# Patient Record
Sex: Female | Born: 1960 | Marital: Single | Smoking: Never smoker
Health system: Southern US, Community
[De-identification: ages and names within clinical notes are randomized; demographics above are authoritative.]

## PROBLEM LIST (undated history)

## (undated) DIAGNOSIS — K219 Gastro-esophageal reflux disease without esophagitis: Secondary | ICD-10-CM

## (undated) DIAGNOSIS — E785 Hyperlipidemia, unspecified: Secondary | ICD-10-CM

## (undated) DIAGNOSIS — T7840XA Allergy, unspecified, initial encounter: Secondary | ICD-10-CM

## (undated) HISTORY — DX: Allergy, unspecified, initial encounter: T78.40XA

## (undated) HISTORY — DX: Hyperlipidemia, unspecified: E78.5

## (undated) HISTORY — DX: Gastro-esophageal reflux disease without esophagitis: K21.9

---

## 2019-06-20 ENCOUNTER — Ambulatory Visit: Payer: Self-pay | Attending: Internal Medicine

## 2019-06-20 DIAGNOSIS — Z23 Encounter for immunization: Secondary | ICD-10-CM

## 2019-06-20 NOTE — Progress Notes (Signed)
   Covid-19 Vaccination Clinic  Name:  Stacey Parrish    MRN: [id number] DOB: 11/30/1960  06/20/2019  [name] was observed post Covid-19 immunization for 15 minutes without incident. She was provided with Vaccine Information Sheet and instruction to access the V-Safe system.   [name] was instructed to call 911 with any severe reactions post vaccine: Marland Kitchen Difficulty breathing  . Swelling of face and throat  . A fast heartbeat  . A bad rash all over body  . Dizziness and weakness   Immunizations Administered    Name Date Dose VIS Date Route   Pfizer COVID-19 Vaccine 06/20/2019 10:59 AM 0.3 mL 02/28/2019 Intramuscular   Manufacturer: [employer], [employer]   Lot: [id number]   NDC: [address]-9

## 2019-07-15 ENCOUNTER — Ambulatory Visit: Payer: Self-pay | Attending: Internal Medicine

## 2019-07-15 DIAGNOSIS — Z23 Encounter for immunization: Secondary | ICD-10-CM

## 2019-07-15 NOTE — Progress Notes (Signed)
   Covid-19 Vaccination Clinic  Name:  Clarence Dunsmore    MRN: [id number] DOB: [birth year]  07/15/2019  Ms. Hechavarria was observed post Covid-19 immunization for 15 minutes without incident. She was provided with Vaccine Information Sheet and instruction to access the V-Safe system.   Ms. Knights was instructed to call 911 with any severe reactions post vaccine: Marland Kitchen Difficulty breathing  . Swelling of face and throat  . A fast heartbeat  . A bad rash all over body  . Dizziness and weakness   Immunizations Administered    Name Date Dose VIS Date Route   Pfizer COVID-19 Vaccine 07/15/2019  4:26 PM 0.3 mL 05/14/2018 Intramuscular   Manufacturer: [employer], [employer]   Lot: [id number]   NDC: [address]-0

## 2020-01-05 DIAGNOSIS — Z1231 Encounter for screening mammogram for malignant neoplasm of breast: Secondary | ICD-10-CM | POA: Diagnosis not present

## 2020-01-05 DIAGNOSIS — Z01419 Encounter for gynecological examination (general) (routine) without abnormal findings: Secondary | ICD-10-CM | POA: Diagnosis not present

## 2020-01-05 DIAGNOSIS — Z124 Encounter for screening for malignant neoplasm of cervix: Secondary | ICD-10-CM | POA: Diagnosis not present

## 2020-01-09 DIAGNOSIS — Z1231 Encounter for screening mammogram for malignant neoplasm of breast: Secondary | ICD-10-CM | POA: Diagnosis not present

## 2020-03-05 ENCOUNTER — Ambulatory Visit: Payer: Self-pay | Admitting: Family Medicine

## 2020-03-05 ENCOUNTER — Encounter: Payer: Self-pay | Admitting: Family Medicine

## 2020-03-05 ENCOUNTER — Ambulatory Visit: Payer: BLUE CROSS/BLUE SHIELD | Admitting: Family Medicine

## 2020-03-05 ENCOUNTER — Other Ambulatory Visit: Payer: Self-pay

## 2020-03-05 VITALS — BP 132/78 | HR 63 | Temp 96.9°F | Ht <= 58 in | Wt 130.1 lb

## 2020-03-05 DIAGNOSIS — N3000 Acute cystitis without hematuria: Secondary | ICD-10-CM | POA: Diagnosis not present

## 2020-03-05 DIAGNOSIS — Z0000 Encounter for general adult medical examination without abnormal findings: Secondary | ICD-10-CM

## 2020-03-05 DIAGNOSIS — R350 Frequency of micturition: Secondary | ICD-10-CM | POA: Diagnosis not present

## 2020-03-05 LAB — POC URINALSYSI DIPSTICK (AUTOMATED)
Bilirubin, UA: NEGATIVE
Blood, UA: 100
Glucose, UA: NEGATIVE
Ketones, UA: NEGATIVE
Nitrite, UA: NEGATIVE
Protein, UA: POSITIVE — AB
Spec Grav, UA: 1.015 (ref 1.010–1.025)
Urobilinogen, UA: 0.2 E.U./dL
pH, UA: 6.5 (ref 5.0–8.0)

## 2020-03-05 LAB — CBC WITH DIFFERENTIAL/PLATELET
Basophils Absolute: 0 10*3/uL (ref 0.0–0.1)
Basophils Relative: 0.6 % (ref 0.0–3.0)
Eosinophils Absolute: 0.1 10*3/uL (ref 0.0–0.7)
Eosinophils Relative: 1.5 % (ref 0.0–5.0)
HCT: 41.5 % (ref 36.0–46.0)
Hemoglobin: 13.8 g/dL (ref 12.0–15.0)
Lymphocytes Relative: 28.9 % (ref 12.0–46.0)
Lymphs Abs: 1.9 10*3/uL (ref 0.7–4.0)
MCHC: 33.2 g/dL (ref 30.0–36.0)
MCV: 86.4 fl (ref 78.0–100.0)
Monocytes Absolute: 0.5 10*3/uL (ref 0.1–1.0)
Monocytes Relative: 7.5 % (ref 3.0–12.0)
Neutro Abs: 4 10*3/uL (ref 1.4–7.7)
Neutrophils Relative %: 61.5 % (ref 43.0–77.0)
Platelets: 303 10*3/uL (ref 150.0–400.0)
RBC: 4.81 Mil/uL (ref 3.87–5.11)
RDW: 12.2 % (ref 11.5–15.5)
WBC: 6.4 10*3/uL (ref 4.0–10.5)

## 2020-03-05 LAB — COMPREHENSIVE METABOLIC PANEL
ALT: 25 U/L (ref 0–35)
AST: 24 U/L (ref 0–37)
Albumin: 4.3 g/dL (ref 3.5–5.2)
Alkaline Phosphatase: 93 U/L (ref 39–117)
BUN: 10 mg/dL (ref 6–23)
CO2: 31 mEq/L (ref 19–32)
Calcium: 9.5 mg/dL (ref 8.4–10.5)
Chloride: 101 mEq/L (ref 96–112)
Creatinine, Ser: 0.71 mg/dL (ref 0.40–1.20)
GFR: 93.59 mL/min (ref >60.00)
Glucose, Bld: 83 mg/dL (ref 70–99)
Potassium: 4.2 mEq/L (ref 3.5–5.1)
Sodium: 138 mEq/L (ref 135–145)
Total Bilirubin: 0.5 mg/dL (ref 0.2–1.2)
Total Protein: 7.5 g/dL (ref 6.0–8.3)

## 2020-03-05 LAB — LIPID PANEL
Cholesterol: 239 mg/dL — ABNORMAL HIGH (ref 0–200)
HDL: 71.3 mg/dL (ref >39.00)
LDL Cholesterol: 153 mg/dL — ABNORMAL HIGH (ref 0–99)
NonHDL: 168.12
Total CHOL/HDL Ratio: 3
Triglycerides: 78 mg/dL (ref 0.0–149.0)
VLDL: 15.6 mg/dL (ref 0.0–40.0)

## 2020-03-05 LAB — TSH: TSH: 4.29 u[IU]/mL (ref 0.35–4.50)

## 2020-03-05 MED ORDER — CEPHALEXIN 250 MG PO CAPS: 250.0000 mg | ORAL_CAPSULE | Freq: Two times a day (BID) | ORAL | 0 refills | Status: DC

## 2020-03-05 NOTE — Progress Notes (Signed)
Subjective:    Patient ID: Stacey Parrish, female    DOB: 1960/07/17, 59 y.o.   MRN: [id number]  This visit occurred during the SARS-CoV-2 public health emergency.  Safety protocols were in place, including screening questions prior to the visit, additional usage of staff PPE, and extensive cleaning of exam room while observing appropriate contact time as indicated for disinfecting solutions.    HPI 59 yo female here to est for primary care   Originally from [place]    Moved here from [place] Really loves it  Lives in [place] here is translating for her  Speaks a little english   Wt Readings from Last 3 Encounters:  03/05/20 130 lb 1 oz (59 kg)   28.39 kg/m   C/o urinary frequency -recently  Dysuria -feels hot and uncomfortable No blood (once in the more distant past) Has had utis in the past  Has hot flashes occ but no fever  No bladder pain    ua today pos for blood and leuk Results for orders placed or performed in visit on 03/05/20  POCT Urinalysis Dipstick (Automated)  Result Value Ref Range   Color, UA Light Yellow    Clarity, UA Clear    Glucose, UA Negative Negative   Bilirubin, UA Negative    Ketones, UA Negative    Spec Grav, UA 1.015 1.010 - 1.025   Blood, UA 100 Ery/uL    pH, UA 6.5 5.0 - 8.0   Protein, UA Positive (A) Negative   Urobilinogen, UA 0.2 0.2 or 1.0 E.U./dL   Nitrite, UA Negative    Leukocytes, UA Small (1+) (A) Negative     Had mammogram 01/09/20  Had her pap 2 mo ago Had ascus with HPV negative  G1P1 LMP 2016  Is menopausal   Pfizer covid imm in April  Has a booster planned through work soon   Colonoscopy was 2019 in [place] ? 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 Acute cystitis 03/05/2020  . Routine general medical examination at a health care facility 03/05/2020   History reviewed. No pertinent past medical history. History reviewed. No pertinent surgical history. Social History   Tobacco Use  . Smoking status: Never Smoker  . Smokeless tobacco: Never Used  Substance Use Topics  . Alcohol use: Not Currently   Family History  Problem Relation Age of Onset  . Arthritis Mother   . Hearing loss Mother   . Hyperlipidemia Mother   . Hypertension Mother   . Alcohol abuse Father    No Known Allergies No current outpatient medications on file prior to visit.   No current facility-administered medications on file prior to visit.    Review of Systems  Constitutional: Negative for activity change, appetite change, fatigue, fever and unexpected weight change.  HENT: Negative for congestion, ear pain, rhinorrhea, sinus pressure and sore throat.   Eyes: Negative for pain, redness and visual disturbance.  Respiratory: Negative for cough, shortness of breath and wheezing.   Cardiovascular: Negative for chest pain and palpitations.  Gastrointestinal: Negative for abdominal pain, blood in stool, constipation and diarrhea.  Endocrine: Negative for polydipsia and polyuria.  Genitourinary: Positive for dysuria, frequency and urgency. Negative for decreased urine volume and vaginal discharge.  Musculoskeletal: Negative for arthralgias, back pain and myalgias.  Skin: Negative for pallor and rash.  Allergic/Immunologic: Negative for environmental allergies.  Neurological: Negative for dizziness, syncope and headaches.  Hematological: Negative for adenopathy. Does not bruise/bleed easily.  Psychiatric/Behavioral: Negative for decreased concentration and dysphoric mood. The patient is not nervous/anxious.        Objective:   Physical Exam Constitutional:      General: She is not in acute distress.    Appearance: Normal appearance. She is  well-developed, normal weight and well-nourished. She is not ill-appearing.  HENT:     Head: Normocephalic and atraumatic.  Eyes:     Extraocular Movements: EOM normal.     Conjunctiva/sclera: Conjunctivae normal.     Pupils: Pupils are equal, round, and reactive to light.  Cardiovascular:     Rate and Rhythm: Normal rate and regular rhythm.     Pulses: Normal pulses.     Heart sounds: Normal heart sounds.  Pulmonary:     Effort: Pulmonary effort is normal.     Breath sounds: Normal breath sounds.  Abdominal:     General: Bowel sounds are normal. There is no distension.     Palpations: Abdomen is soft.     Tenderness: There is abdominal tenderness. There is no rebound.     Comments: No cva tenderness  Mild suprapubic tenderness  Musculoskeletal:        General: No edema.     Cervical back: Normal range of motion and neck supple.     Comments: No kyphosis   Lymphadenopathy:     Cervical: No cervical adenopathy.  Skin:    Findings: No erythema or rash.  Neurological:     Mental Status: She is alert.     Coordination: Coordination normal.     Deep Tendon Reflexes: Reflexes normal.  Psychiatric:        Mood and Affect: Mood and affect and mood normal.     Comments: Pleasant  Son translates for her           Assessment & Plan:   Problem List Items Addressed This Visit      Genitourinary   Acute cystitis - Primary    Pending urine cx tx with keflex Meds ordered this encounter  Medications  . cephALEXin (KEFLEX) 250 MG capsule    Sig: Take 1 capsule (250 mg total) by mouth 2 (two) times daily.    Dispense:  14 capsule    Refill:  0   Enc water intake  Handout given  Per hx- has had these in the past  inst to call if symptoms worsen       Relevant Orders   Urine Culture (Completed)     Other   Routine general medical examination at a health care facility    Labs done for general care/prevention today  (new pt)  Pap and mammogram are up to date  Rev imms   Planning covid booster soon  Treating for uti   Sent for last colonoscopy report       Relevant Orders   Comprehensive metabolic panel (Completed)   CBC with Differential/Platelet (Completed)   Lipid panel (Completed)   TSH (Completed)    Other Visit Diagnoses    Urinary frequency       Relevant Orders   POCT Urinalysis Dipstick (Automated) (Completed)

## 2020-03-05 NOTE — Patient Instructions (Addendum)
Labs today for screening/wellness  You may have a urinary tract infection  Drink lots of water  Take keflex (antibiotic) as directed  We will culture your urine and call with result   Take care of yourself   Get your covid booster   Stop at check out so we can send for your colonocopy report

## 2020-03-06 LAB — URINE CULTURE
MICRO NUMBER:: 11332125
SPECIMEN QUALITY:: ADEQUATE

## 2020-03-07 NOTE — Assessment & Plan Note (Signed)
Labs done for general care/prevention today  (new pt)  Pap and mammogram are up to date  Rev imms  Planning covid booster soon  Treating for uti   Sent for last colonoscopy report

## 2020-03-07 NOTE — Assessment & Plan Note (Signed)
Pending urine cx tx with keflex Meds ordered this encounter  Medications  . cephALEXin (KEFLEX) 250 MG capsule    Sig: Take 1 capsule (250 mg total) by mouth 2 (two) times daily.    Dispense:  14 capsule    Refill:  0   Enc water intake  Handout given  Per hx- has had these in the past  inst to call if symptoms worsen

## 2020-03-11 ENCOUNTER — Telehealth: Payer: Self-pay | Admitting: *Deleted

## 2020-03-11 NOTE — Telephone Encounter (Signed)
-----   Message from Judy Pimple, MD sent at 03/07/2020  9:50 AM EST ----- Urine cx was contaminated- please ask if symptoms are improved with abx ?  Cholesterol is high  Avoid red meat/ fried foods/ egg yolks/ fatty breakfast meats/ butter, cheese and high fat dairy/ and shellfish    I would like to re check lipid panel in 2-3 mo after watching diet  Other labs ok

## 2020-03-11 NOTE — Telephone Encounter (Signed)
Left VM requesting pt to call the office back 

## 2020-03-17 ENCOUNTER — Other Ambulatory Visit: Payer: Self-pay | Admitting: Family Medicine

## 2020-03-17 ENCOUNTER — Telehealth: Payer: Self-pay | Admitting: Family Medicine

## 2020-03-17 MED ORDER — CIPROFLOXACIN HCL 250 MG PO TABS: 250.0000 mg | ORAL_TABLET | Freq: Two times a day (BID) | ORAL | 0 refills | Status: DC

## 2020-03-17 NOTE — Telephone Encounter (Signed)
Pt son notified of Dr. Royden Purl comments and instructions and verbalized understanding

## 2020-03-17 NOTE — Telephone Encounter (Signed)
I sent in a different abx (cipro) to take bid for 5 d  Watch for side effects (like muscle pain) and let me know   Please drop off sample whenever able so we can get as clean catch as possible and try to avoid contamination  Thanks for letting me know

## 2020-03-17 NOTE — Telephone Encounter (Signed)
-----   Message from Shon Millet, [place] sent at 03/17/2020  3:20 PM EST ----- Spoke to pt's son advise of lab results and Dr. Royden Purl comments. F/u lab appt scheduled.  Pt's son said that urinary sxs are worse. She was 1st saying she was only having urinary frequency but now she has developed dysuria also. Son said pt's almost done with abx Dr. Milinda Antis prescribed and she hasn't had any improvement. Son wants to know what to do, please advise

## 2020-03-18 NOTE — Telephone Encounter (Signed)
Note on med says there is a back order on Cipro and ask if PCP can send in an alt abx

## 2020-03-18 NOTE — Telephone Encounter (Signed)
I sent levaquin

## 2020-03-26 ENCOUNTER — Telehealth: Payer: Self-pay | Admitting: Family Medicine

## 2020-03-26 ENCOUNTER — Other Ambulatory Visit (INDEPENDENT_AMBULATORY_CARE_PROVIDER_SITE_OTHER): Payer: BLUE CROSS/BLUE SHIELD

## 2020-03-26 DIAGNOSIS — N3000 Acute cystitis without hematuria: Secondary | ICD-10-CM

## 2020-03-26 LAB — URINALYSIS, ROUTINE W REFLEX MICROSCOPIC
Bilirubin Urine: NEGATIVE
Ketones, ur: NEGATIVE
Leukocytes,Ua: NEGATIVE
Nitrite: NEGATIVE
Specific Gravity, Urine: 1.02 (ref 1.000–1.030)
Total Protein, Urine: NEGATIVE
Urine Glucose: NEGATIVE
Urobilinogen, UA: 0.2 (ref 0.0–1.0)
WBC, UA: NONE SEEN (ref 0–?)
pH: 5.5 (ref 5.0–8.0)

## 2020-03-26 NOTE — Telephone Encounter (Signed)
Thanks so much. 

## 2020-03-26 NOTE — Telephone Encounter (Signed)
-----   Message from Alvina Chou sent at 03/26/2020  2:53 PM EST ----- Regarding: lab order I ordered ua and culture. Son brought her in, didn't have orders.

## 2020-03-27 LAB — URINE CULTURE
MICRO NUMBER:: 11394471
SPECIMEN QUALITY:: ADEQUATE

## 2020-05-14 ENCOUNTER — Other Ambulatory Visit: Payer: Self-pay

## 2020-05-14 ENCOUNTER — Encounter: Payer: Self-pay | Admitting: Family Medicine

## 2020-05-14 ENCOUNTER — Ambulatory Visit: Payer: BLUE CROSS/BLUE SHIELD | Admitting: Family Medicine

## 2020-05-14 ENCOUNTER — Other Ambulatory Visit: Payer: BLUE CROSS/BLUE SHIELD

## 2020-05-14 VITALS — BP 128/70 | HR 64 | Temp 96.9°F | Ht <= 58 in | Wt 131.2 lb

## 2020-05-14 DIAGNOSIS — R3129 Other microscopic hematuria: Secondary | ICD-10-CM | POA: Insufficient documentation

## 2020-05-14 DIAGNOSIS — R1013 Epigastric pain: Secondary | ICD-10-CM

## 2020-05-14 DIAGNOSIS — E7800 Pure hypercholesterolemia, unspecified: Secondary | ICD-10-CM | POA: Diagnosis not present

## 2020-05-14 DIAGNOSIS — Z87448 Personal history of other diseases of urinary system: Secondary | ICD-10-CM

## 2020-05-14 DIAGNOSIS — E785 Hyperlipidemia, unspecified: Secondary | ICD-10-CM | POA: Insufficient documentation

## 2020-05-14 LAB — POC URINALSYSI DIPSTICK (AUTOMATED)
Bilirubin, UA: NEGATIVE
Blood, UA: 100
Glucose, UA: NEGATIVE
Ketones, UA: NEGATIVE
Leukocytes, UA: NEGATIVE
Nitrite, UA: NEGATIVE
Protein, UA: NEGATIVE
Spec Grav, UA: 1.025 (ref 1.010–1.025)
Urobilinogen, UA: 0.2 E.U./dL
pH, UA: 6 (ref 5.0–8.0)

## 2020-05-14 MED ORDER — FAMOTIDINE 20 MG PO TABS: 20.0000 mg | ORAL_TABLET | Freq: Two times a day (BID) | ORAL | 11 refills | Status: DC

## 2020-05-14 NOTE — Assessment & Plan Note (Signed)
Heartburn and regurg of acid Rev diet  Px pepcid to try bid and report back  Update if worse or not imp in 2 week

## 2020-05-14 NOTE — Progress Notes (Signed)
Subjective:    Patient ID: Stacey Parrish, female    DOB: Feb 07, 1961, 60 y.o.   MRN: [id number]  This visit occurred during the SARS-CoV-2 public health emergency.  Safety protocols were in place, including screening questions prior to the visit, additional usage of staff PPE, and extensive cleaning of exam room while observing appropriate contact time as indicated for disinfecting solutions.    HPI Pt presents for urinary complaint and hyperlipidemia  Wt Readings from Last 3 Encounters:  05/14/20 131 lb 3 oz (59.5 kg)  03/05/20 130 lb 1 oz (59 kg)   28.64 kg/m   Was tx on 12/17 for uti with positive ua with keflex  Her cx came back contaminated  We changed abx to cipro when she developed worse dysuria  Now some improvement  2nd cx also came back with normal flora   Still has a little bit of "hot" feeling urination  First thing in the am  No fever  R low back hurt last week  No visible blood   Drinking a lot of water  Sometimes frequency No incontinence    Both of last ua had blood in it   Today: Results for orders placed or performed in visit on 05/14/20  POCT Urinalysis Dipstick (Automated)  Result Value Ref Range   Color, UA Yellow    Clarity, UA Clear    Glucose, UA Negative Negative   Bilirubin, UA Negative    Ketones, UA Negative    Spec Grav, UA 1.025 1.010 - 1.025   Blood, UA 100 Ery/uL    pH, UA 6.0 5.0 - 8.0   Protein, UA Negative Negative   Urobilinogen, UA 0.2 0.2 or 1.0 E.U./dL   Nitrite, UA Negative    Leukocytes, UA Negative Negative     Lab Results  Component Value Date   CREATININE 0.71 03/05/2020   BUN 10 03/05/2020   NA 138 03/05/2020   K 4.2 03/05/2020   CL 101 03/05/2020   CO2 31 03/05/2020     Hyperlipidemia Lab Results  Component Value Date   CHOL 239 (H) 03/05/2020   HDL 71.30 03/05/2020   LDLCALC 153 (H) 03/05/2020   TRIG 78.0 03/05/2020   CHOLHDL 3 03/05/2020   Diet  Some fried food/not daily   1-2 times per week   No fast food  Some chips  No sausage or bacon  No cheese/ice cream No beef  No shellfish   Some heartburn  2-3 mo  Tastes acid in mouth  Diet has not changed    Mother had high cholesterol   Patient Active Problem List   Diagnosis Date Noted  . Hyperlipidemia 05/14/2020  . Dyspepsia 05/14/2020  . Microscopic hematuria 05/14/2020  . Acute cystitis 03/05/2020  . Routine general medical examination at a health care facility 03/05/2020   No past medical history on file. No past surgical history on file. Social History   Tobacco Use  . Smoking status: Never Smoker  . Smokeless tobacco: Never Used  Substance Use Topics  . Alcohol use: Not Currently   Family History  Problem Relation Age of Onset  . Arthritis Mother   . Hearing loss Mother   . Hyperlipidemia Mother   . Hypertension Mother   . Alcohol abuse Father    No Known Allergies No current outpatient medications on file prior to visit.   No current facility-administered medications on file prior to visit.    Review of Systems  Constitutional: Negative for activity change, appetite  change, fatigue, fever and unexpected weight change.  HENT: Negative for congestion, ear pain, rhinorrhea, sinus pressure and sore throat.   Eyes: Negative for pain, redness and visual disturbance.  Respiratory: Negative for cough, shortness of breath and wheezing.   Cardiovascular: Negative for chest pain and palpitations.  Gastrointestinal: Negative for abdominal distention, abdominal pain, blood in stool, constipation and diarrhea.       Heartburn  Endocrine: Negative for polydipsia and polyuria.  Genitourinary: Positive for dysuria. Negative for frequency, pelvic pain and urgency.  Musculoskeletal: Negative for arthralgias, back pain and myalgias.  Skin: Negative for pallor and rash.  Allergic/Immunologic: Negative for environmental allergies.  Neurological: Negative for dizziness, syncope and headaches.  Hematological:  Negative for adenopathy. Does not bruise/bleed easily.  Psychiatric/Behavioral: Negative for decreased concentration and dysphoric mood. The patient is not nervous/anxious.        Objective:   Physical Exam Constitutional:      General: She is not in acute distress.    Appearance: She is well-developed and well-nourished.  HENT:     Head: Normocephalic and atraumatic.     Mouth/Throat:     Mouth: Oropharynx is clear and moist.  Eyes:     General: No scleral icterus.    Extraocular Movements: EOM normal.     Conjunctiva/sclera: Conjunctivae normal.     Pupils: Pupils are equal, round, and reactive to light.  Cardiovascular:     Rate and Rhythm: Normal rate and regular rhythm.     Heart sounds: Normal heart sounds.  Pulmonary:     Effort: Pulmonary effort is normal. No respiratory distress.     Breath sounds: Normal breath sounds. No wheezing or rales.  Abdominal:     General: Abdomen is flat. Bowel sounds are normal. There is no distension or abdominal bruit.     Palpations: Abdomen is soft. There is no hepatomegaly, splenomegaly or mass.     Tenderness: There is no abdominal tenderness. There is no right CVA tenderness, left CVA tenderness, guarding or rebound.     Hernia: No hernia is present.  Musculoskeletal:     Cervical back: Normal range of motion and neck supple.  Lymphadenopathy:     Cervical: No cervical adenopathy.  Skin:    General: Skin is warm and dry.     Coloration: Skin is not jaundiced or pale.     Findings: No erythema or rash.  Neurological:     Mental Status: She is alert.  Psychiatric:        Mood and Affect: Mood and affect and mood normal.           Assessment & Plan:   Problem List Items Addressed This Visit      Genitourinary   Microscopic hematuria - Primary    Persistent despite improvement of uti  ua clear except blood  Very seldom has dysuria in am  No gross hematuria  Nl exam  Ref made to urology      Relevant Orders    Ambulatory referral to Urology     Other   Hyperlipidemia    Disc goals for lipids and reasons to control them Rev last labs with pt Rev low sat fat diet in detail Good HDL LDL 150s Given handouts Diet change planned Re check 3 mo  If no imp- consider statin       Dyspepsia    Heartburn and regurg of acid Rev diet  Px pepcid to try bid and report back  Update if  worse or not imp in 2 week       Other Visit Diagnoses    History of hematuria       Relevant Orders   POCT Urinalysis Dipstick (Automated) (Completed)

## 2020-05-14 NOTE — Assessment & Plan Note (Signed)
Persistent despite improvement of uti  ua clear except blood  Very seldom has dysuria in am  No gross hematuria  Nl exam  Ref made to urology

## 2020-05-14 NOTE — Patient Instructions (Addendum)
For cholesterol Avoid red meat/ fried foods/ egg yolks/ fatty breakfast meats/ butter, cheese and high fat dairy/ and shellfish    For acid reflux- start the pepcid I sent in to your pharmacy  Avoid too much caffeine /coffee or anything else that flares it  Do not lie down right after eating    Let's check labs in 3 months for cholesterol  If not improved we may want to start medicine   I placed a urology referral for the blood in urine  You will get a call to set this up

## 2020-05-14 NOTE — Assessment & Plan Note (Signed)
Disc goals for lipids and reasons to control them Rev last labs with pt Rev low sat fat diet in detail Good HDL LDL 150s Given handouts Diet change planned Re check 3 mo  If no imp- consider statin

## 2020-06-11 ENCOUNTER — Encounter: Payer: Self-pay | Admitting: Urology

## 2020-06-11 ENCOUNTER — Ambulatory Visit: Payer: BLUE CROSS/BLUE SHIELD | Admitting: Urology

## 2020-06-11 ENCOUNTER — Other Ambulatory Visit: Payer: Self-pay

## 2020-06-11 VITALS — BP 151/84 | HR 73 | Ht <= 58 in | Wt 133.7 lb

## 2020-06-11 DIAGNOSIS — R3129 Other microscopic hematuria: Secondary | ICD-10-CM

## 2020-06-11 LAB — MICROSCOPIC EXAMINATION: Bacteria, UA: NONE SEEN

## 2020-06-11 LAB — URINALYSIS, COMPLETE
Bilirubin, UA: NEGATIVE
Glucose, UA: NEGATIVE
Ketones, UA: NEGATIVE
Nitrite, UA: NEGATIVE
Protein,UA: NEGATIVE
Specific Gravity, UA: <1.005 — ABNORMAL LOW (ref 1.005–1.030)
Urobilinogen, Ur: 0.2 mg/dL (ref 0.2–1.0)
pH, UA: 5.5 (ref 5.0–7.5)

## 2020-06-11 NOTE — Progress Notes (Signed)
   06/11/2020 10:09 AM   Stacey Parrish 08/03/1960 [id number]  Referring provider: Judy Pimple, MD [address] [place],  [place] [address]  Chief Complaint  Patient presents with  . Hematuria    HPI: Stacey Parrish is a 60 y.o. [place] female referred for evaluation of microhematuria.  She presents today with her son who served as Equities trader.     Seen by PCP 03/05/2020 complaining of urinary frequency  Dipstick urinalysis showed small leukocytes and blood in treated empirically with antibiotics  Urine culture grew mixed flora  Subsequently developed mild dysuria  Antibiotic was changed and symptoms persisted  Repeat urinalysis 03/26/2018 with 3-6 RBCs and repeat urine culture negative  Still with intermittent mild dysuria  Mild bilateral low back pain  No prior tobacco history  PMH: History reviewed. No pertinent past medical history.  Surgical History: History reviewed. No pertinent surgical history.  Home Medications:  Allergies as of 06/11/2020   No Known Allergies     Medication List       Accurate as of June 11, 2020 10:09 AM. If you have any questions, ask your nurse or doctor.        famotidine 20 MG tablet Commonly known as: PEPCID Take 1 tablet (20 mg total) by mouth 2 (two) times daily.       Allergies: No Known Allergies  Family History: Family History  Problem Relation Age of Onset  . Arthritis Mother   . Hearing loss Mother   . Hyperlipidemia Mother   . Hypertension Mother   . Alcohol abuse Father     Social History:  reports that she has never smoked. She has never used smokeless tobacco. She reports previous alcohol use. She reports that she does not use drugs.   Physical Exam: BP (!) 151/84 (BP Location: Left Arm, Patient Position: Sitting, Cuff Size: Large)   Pulse 73   Ht 4' 8.75" (1.441 m)   Wt 133 lb 11.2 oz (60.6 kg)   BMI 29.19 kg/m   Constitutional:  Alert, No acute distress. HEENT: [place] AT, moist  mucus membranes.  Trachea midline, no masses. Cardiovascular: No clubbing, cyanosis, or edema. Respiratory: Normal respiratory effort, no increased work of breathing. Neurologic: Grossly intact, no focal deficits, moving all 4 extremities. Psychiatric: Normal mood and affect.  Laboratory Data:  Urinalysis Dipstick 2+ blood/trace leukocytes Microscopy 3-10 RBC   Assessment & Plan:    1. Microscopic hematuria  AUA hematuria risk stratification: Intermediate  The recommended evaluation of intermediate risk hematuria was discussed in detail to include renal ultrasound and cystoscopy  She is in agreement with proceeding with further evaluation  The procedures were discussed in detail    Riki Altes, MD  [facility] [address], [address] [place], [place] [address] [phone]

## 2020-06-13 ENCOUNTER — Encounter: Payer: Self-pay | Admitting: Urology

## 2020-06-24 ENCOUNTER — Ambulatory Visit
Admission: RE | Admit: 2020-06-24 | Discharge: 2020-06-24 | Disposition: A | Discharge status: Home / Self Care | Payer: BLUE CROSS/BLUE SHIELD | Source: Ambulatory Visit | Attending: Urology | Admitting: Urology

## 2020-06-24 ENCOUNTER — Other Ambulatory Visit: Payer: Self-pay

## 2020-06-24 DIAGNOSIS — R3129 Other microscopic hematuria: Secondary | ICD-10-CM | POA: Diagnosis not present

## 2020-06-24 DIAGNOSIS — N2889 Other specified disorders of kidney and ureter: Secondary | ICD-10-CM | POA: Diagnosis not present

## 2020-07-04 ENCOUNTER — Telehealth: Payer: Self-pay | Admitting: Urology

## 2020-07-04 DIAGNOSIS — N2889 Other specified disorders of kidney and ureter: Secondary | ICD-10-CM

## 2020-07-04 NOTE — Telephone Encounter (Signed)
Renal ultrasound shows a small right renal mass.  Need to schedule renal mass protocol CT for further evaluation.  Order was entered.  She is scheduled for cystoscopy 07/16/2020.  May need to speak with her son who is on [facility]

## 2020-07-14 ENCOUNTER — Other Ambulatory Visit: Payer: BLUE CROSS/BLUE SHIELD | Admitting: Urology

## 2020-07-16 ENCOUNTER — Other Ambulatory Visit: Payer: BLUE CROSS/BLUE SHIELD | Admitting: Urology

## 2020-08-12 ENCOUNTER — Telehealth: Payer: Self-pay | Admitting: Family Medicine

## 2020-08-12 DIAGNOSIS — E7800 Pure hypercholesterolemia, unspecified: Secondary | ICD-10-CM

## 2020-08-12 NOTE — Telephone Encounter (Signed)
-----   Message from Aquilla Solian, RT sent at 07/26/2020 10:48 AM EDT ----- Regarding: Lab Orders for Friday 5.27.2022 Please place lab orders for Friday 5.27.2022, appt notes state "3 month labs" Thank you, Jones Bales RT(R)

## 2020-08-13 ENCOUNTER — Other Ambulatory Visit: Payer: Self-pay

## 2020-08-13 ENCOUNTER — Other Ambulatory Visit (INDEPENDENT_AMBULATORY_CARE_PROVIDER_SITE_OTHER): Payer: BLUE CROSS/BLUE SHIELD

## 2020-08-13 DIAGNOSIS — E7800 Pure hypercholesterolemia, unspecified: Secondary | ICD-10-CM

## 2020-08-13 LAB — LIPID PANEL
Cholesterol: 221 mg/dL — ABNORMAL HIGH (ref 0–200)
HDL: 61 mg/dL (ref >39.00)
LDL Cholesterol: 144 mg/dL — ABNORMAL HIGH (ref 0–99)
NonHDL: 160.06
Total CHOL/HDL Ratio: 4
Triglycerides: 78 mg/dL (ref 0.0–149.0)
VLDL: 15.6 mg/dL (ref 0.0–40.0)

## 2020-08-20 ENCOUNTER — Other Ambulatory Visit: Payer: Self-pay

## 2020-08-20 ENCOUNTER — Other Ambulatory Visit: Payer: BLUE CROSS/BLUE SHIELD | Admitting: Urology

## 2020-08-20 ENCOUNTER — Ambulatory Visit (INDEPENDENT_AMBULATORY_CARE_PROVIDER_SITE_OTHER): Payer: BLUE CROSS/BLUE SHIELD | Admitting: Urology

## 2020-08-20 ENCOUNTER — Encounter: Payer: Self-pay | Admitting: Urology

## 2020-08-20 VITALS — BP 155/82 | HR 76 | Ht <= 58 in | Wt 130.0 lb

## 2020-08-20 DIAGNOSIS — R3129 Other microscopic hematuria: Secondary | ICD-10-CM | POA: Diagnosis not present

## 2020-08-20 NOTE — Progress Notes (Signed)
   08/20/20  CC:  Chief Complaint  Patient presents with  . Cysto    HPI: 60 y.o. female seen 06/11/2020 with microhematuria and complaints of dysuria.  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  A renal mass protocol CT was ordered and is scheduled for 09/03/2020.  Her dysuria has resolved.  Denies gross hematuria.  UA today with 11-30 RBC  Blood pressure (!) 155/82, pulse 76, height 4\' 9"  (1.448 m), weight 130 lb (59 kg). NED. A&Ox3.   No respiratory distress   Abd soft, NT, ND Normal external genitalia with patent urethral meatus  Cystoscopy Procedure Note  Patient identification was confirmed, informed consent was obtained, and patient was prepped using Betadine solution.  Lidocaine jelly was administered per urethral meatus.    Procedure: - Flexible cystoscope introduced, without any difficulty.   - Thorough search of the bladder revealed:    normal urethral meatus    normal urothelium    no stones    no ulcers     no tumors    no urethral polyps    no trabeculation  - Ureteral orifices were normal in position and appearance.  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

## 2020-08-21 LAB — URINALYSIS, COMPLETE
Bilirubin, UA: NEGATIVE
Glucose, UA: NEGATIVE
Ketones, UA: NEGATIVE
Leukocytes,UA: NEGATIVE
Nitrite, UA: NEGATIVE
Protein,UA: NEGATIVE
Specific Gravity, UA: 1.015 (ref 1.005–1.030)
Urobilinogen, Ur: 0.2 mg/dL (ref 0.2–1.0)
pH, UA: 6 (ref 5.0–7.5)

## 2020-08-21 LAB — MICROSCOPIC EXAMINATION: Bacteria, UA: NONE SEEN

## 2020-08-26 ENCOUNTER — Encounter: Payer: Self-pay | Admitting: *Deleted

## 2020-09-03 ENCOUNTER — Ambulatory Visit
Admission: RE | Admit: 2020-09-03 | Discharge: 2020-09-03 | Disposition: A | Discharge status: Home / Self Care | Payer: BLUE CROSS/BLUE SHIELD | Source: Ambulatory Visit | Attending: Urology | Admitting: Urology

## 2020-09-03 ENCOUNTER — Other Ambulatory Visit: Payer: Self-pay

## 2020-09-03 DIAGNOSIS — N2889 Other specified disorders of kidney and ureter: Secondary | ICD-10-CM | POA: Diagnosis not present

## 2020-09-03 DIAGNOSIS — K7689 Other specified diseases of liver: Secondary | ICD-10-CM | POA: Diagnosis not present

## 2020-09-03 DIAGNOSIS — N281 Cyst of kidney, acquired: Secondary | ICD-10-CM | POA: Diagnosis not present

## 2020-09-03 DIAGNOSIS — N200 Calculus of kidney: Secondary | ICD-10-CM | POA: Diagnosis not present

## 2020-09-03 MED ORDER — IOHEXOL 300 MG/ML  SOLN
100.0000 mL | Freq: Once | INTRAMUSCULAR | Status: AC | PRN
  Administered 2020-09-03: 100 mL via INTRAVENOUS

## 2020-09-07 ENCOUNTER — Telehealth: Payer: Self-pay | Admitting: *Deleted

## 2020-09-07 DIAGNOSIS — N2889 Other specified disorders of kidney and ureter: Secondary | ICD-10-CM

## 2020-09-07 NOTE — Telephone Encounter (Signed)
Notified patient as instructed, patient pleased. Discussed follow-up appointments, patient agrees  

## 2020-09-07 NOTE — Telephone Encounter (Signed)
-----   Message from Riki Altes, MD sent at 09/07/2020  3:38 PM EDT ----- CT showed small renal cysts but no fatty benign tumor that the ultrasound suggested.  She also has bilateral renal calculi which are not causing blockage.  Would recommend a 53-month follow-up with [facility]

## 2020-09-10 ENCOUNTER — Telehealth: Payer: Self-pay | Admitting: Family Medicine

## 2020-09-10 DIAGNOSIS — E7800 Pure hypercholesterolemia, unspecified: Secondary | ICD-10-CM

## 2020-09-10 MED ORDER — ROSUVASTATIN CALCIUM 5 MG PO TABS
5.0000 mg | ORAL_TABLET | Freq: Every day | ORAL | 3 refills | Status: DC
Start: 2020-09-10 — End: 2021-06-10

## 2020-09-10 NOTE — Telephone Encounter (Signed)
Pt/son notified of Dr. Royden Purl comments, and that Rx was sent to pharmacy. Lab appt scheduled

## 2020-09-10 NOTE — Addendum Note (Signed)
Addended by: Roxy Manns A on: 09/10/2020 12:54 PM   Modules accepted: Orders

## 2020-09-10 NOTE — Telephone Encounter (Signed)
Stacey Parrish son called and stated that they received a letter about trying new medication. He stated that she is willing to try that medication.   Pharmacy- [facility]- [address]

## 2020-09-10 NOTE — Telephone Encounter (Signed)
It was regarding her last labs. Per Dr. Royden Purl note:  Cholesterol improved (LDL of 144 down from 153) with a better diet Goal is less than 100  I recommend a statin (crestor 5 mg) if open to it

## 2020-09-10 NOTE — Telephone Encounter (Signed)
I sent crestor  Take daily (better in evening) with a low fat snack If side effects, please call (incl muscle pain) Schedule fasting lab 6 wk

## 2020-10-22 ENCOUNTER — Other Ambulatory Visit: Payer: Self-pay

## 2020-10-22 ENCOUNTER — Other Ambulatory Visit (INDEPENDENT_AMBULATORY_CARE_PROVIDER_SITE_OTHER): Payer: BLUE CROSS/BLUE SHIELD

## 2020-10-22 DIAGNOSIS — E7800 Pure hypercholesterolemia, unspecified: Secondary | ICD-10-CM | POA: Diagnosis not present

## 2020-10-22 NOTE — Addendum Note (Signed)
Addended by: Aquilla Solian on: 10/22/2020 03:22 PM   Modules accepted: Orders

## 2020-10-23 LAB — AST: AST: 20 U/L (ref 10–35)

## 2020-10-23 LAB — LIPID PANEL
Cholesterol: 165 mg/dL (ref <200)
HDL: 65 mg/dL (ref >=50)
LDL Cholesterol (Calc): 84 mg/dL (calc)
Non-HDL Cholesterol (Calc): 100 mg/dL (calc) (ref <130)
Total CHOL/HDL Ratio: 2.5 (calc) (ref <5.0)
Triglycerides: 69 mg/dL (ref <150)

## 2020-10-23 LAB — ALT: ALT: 18 U/L (ref 6–29)

## 2020-10-25 ENCOUNTER — Encounter: Payer: Self-pay | Admitting: *Deleted

## 2021-01-06 DIAGNOSIS — Z23 Encounter for immunization: Secondary | ICD-10-CM | POA: Diagnosis not present

## 2021-03-04 DIAGNOSIS — Z1231 Encounter for screening mammogram for malignant neoplasm of breast: Secondary | ICD-10-CM | POA: Diagnosis not present

## 2021-03-04 LAB — HM MAMMOGRAPHY

## 2021-03-09 ENCOUNTER — Ambulatory Visit: Payer: BLUE CROSS/BLUE SHIELD | Admitting: Urology

## 2021-03-09 ENCOUNTER — Encounter: Payer: Self-pay | Admitting: Urology

## 2021-03-10 ENCOUNTER — Encounter: Payer: Self-pay | Admitting: Family Medicine

## 2021-05-01 ENCOUNTER — Other Ambulatory Visit: Payer: Self-pay | Admitting: Family Medicine

## 2021-05-04 NOTE — Telephone Encounter (Signed)
Pt is due for a CPE or at least a f/u. Please schedule appt and then route back to me to refill med

## 2021-05-06 NOTE — Telephone Encounter (Signed)
Lvm for pt to schedule F/U or CPE

## 2021-05-11 NOTE — Telephone Encounter (Signed)
2nd attempt lvm for pt to call and schedule cpe/lab

## 2021-05-13 NOTE — Telephone Encounter (Signed)
Pt has scheduled appts, med refilled once

## 2021-06-02 ENCOUNTER — Telehealth: Payer: Self-pay | Admitting: Family Medicine

## 2021-06-02 NOTE — Telephone Encounter (Signed)
-----   Message from Alvina Chou sent at 05/26/2021 10:37 AM EST ----- ?Regarding: Lab orders for Friday, 3.17.23 ?Patient is scheduled for CPX labs, please order future labs, Thanks , Terri ? ? ?

## 2021-06-03 ENCOUNTER — Other Ambulatory Visit: Payer: Self-pay

## 2021-06-03 ENCOUNTER — Other Ambulatory Visit (INDEPENDENT_AMBULATORY_CARE_PROVIDER_SITE_OTHER): Payer: BLUE CROSS/BLUE SHIELD

## 2021-06-03 DIAGNOSIS — Z0000 Encounter for general adult medical examination without abnormal findings: Secondary | ICD-10-CM

## 2021-06-03 DIAGNOSIS — E7800 Pure hypercholesterolemia, unspecified: Secondary | ICD-10-CM | POA: Diagnosis not present

## 2021-06-03 LAB — LIPID PANEL
Cholesterol: 228 mg/dL — ABNORMAL HIGH (ref 0–200)
HDL: 61 mg/dL (ref >39.00)
LDL Cholesterol: 139 mg/dL — ABNORMAL HIGH (ref 0–99)
NonHDL: 166.5
Total CHOL/HDL Ratio: 4
Triglycerides: 137 mg/dL (ref 0.0–149.0)
VLDL: 27.4 mg/dL (ref 0.0–40.0)

## 2021-06-03 LAB — COMPREHENSIVE METABOLIC PANEL
ALT: 19 U/L (ref 0–35)
AST: 18 U/L (ref 0–37)
Albumin: 4.1 g/dL (ref 3.5–5.2)
Alkaline Phosphatase: 87 U/L (ref 39–117)
BUN: 12 mg/dL (ref 6–23)
CO2: 30 mEq/L (ref 19–32)
Calcium: 9.4 mg/dL (ref 8.4–10.5)
Chloride: 100 mEq/L (ref 96–112)
Creatinine, Ser: 0.78 mg/dL (ref 0.40–1.20)
GFR: 82.87 mL/min (ref >60.00)
Glucose, Bld: 85 mg/dL (ref 70–99)
Potassium: 4.6 mEq/L (ref 3.5–5.1)
Sodium: 138 mEq/L (ref 135–145)
Total Bilirubin: 0.4 mg/dL (ref 0.2–1.2)
Total Protein: 7.3 g/dL (ref 6.0–8.3)

## 2021-06-03 LAB — CBC WITH DIFFERENTIAL/PLATELET
Basophils Absolute: 0 10*3/uL (ref 0.0–0.1)
Basophils Relative: 0.6 % (ref 0.0–3.0)
Eosinophils Absolute: 0.1 10*3/uL (ref 0.0–0.7)
Eosinophils Relative: 1 % (ref 0.0–5.0)
HCT: 41.4 % (ref 36.0–46.0)
Hemoglobin: 13.7 g/dL (ref 12.0–15.0)
Lymphocytes Relative: 27.4 % (ref 12.0–46.0)
Lymphs Abs: 2 10*3/uL (ref 0.7–4.0)
MCHC: 33 g/dL (ref 30.0–36.0)
MCV: 87.3 fl (ref 78.0–100.0)
Monocytes Absolute: 0.6 10*3/uL (ref 0.1–1.0)
Monocytes Relative: 8.2 % (ref 3.0–12.0)
Neutro Abs: 4.6 10*3/uL (ref 1.4–7.7)
Neutrophils Relative %: 62.8 % (ref 43.0–77.0)
Platelets: 320 10*3/uL (ref 150.0–400.0)
RBC: 4.74 Mil/uL (ref 3.87–5.11)
RDW: 12.4 % (ref 11.5–15.5)
WBC: 7.3 10*3/uL (ref 4.0–10.5)

## 2021-06-03 LAB — TSH: TSH: 5.01 u[IU]/mL (ref 0.35–5.50)

## 2021-06-10 ENCOUNTER — Ambulatory Visit (INDEPENDENT_AMBULATORY_CARE_PROVIDER_SITE_OTHER): Payer: BLUE CROSS/BLUE SHIELD | Admitting: Family Medicine

## 2021-06-10 ENCOUNTER — Other Ambulatory Visit: Payer: Self-pay

## 2021-06-10 ENCOUNTER — Encounter: Payer: Self-pay | Admitting: Family Medicine

## 2021-06-10 VITALS — BP 136/82 | HR 76 | Temp 97.9°F | Ht <= 58 in | Wt 136.2 lb

## 2021-06-10 DIAGNOSIS — Z0000 Encounter for general adult medical examination without abnormal findings: Secondary | ICD-10-CM | POA: Diagnosis not present

## 2021-06-10 DIAGNOSIS — Z23 Encounter for immunization: Secondary | ICD-10-CM | POA: Diagnosis not present

## 2021-06-10 DIAGNOSIS — E7800 Pure hypercholesterolemia, unspecified: Secondary | ICD-10-CM

## 2021-06-10 MED ORDER — ROSUVASTATIN CALCIUM 5 MG PO TABS: 5.0000 mg | ORAL_TABLET | Freq: Every day | ORAL | 3 refills | Status: DC

## 2021-06-10 MED ORDER — FAMOTIDINE 20 MG PO TABS: 20.0000 mg | ORAL_TABLET | Freq: Two times a day (BID) | ORAL | 3 refills | Status: DC

## 2021-06-10 NOTE — Assessment & Plan Note (Signed)
Reviewed health habits including diet and exercise and skin cancer prevention ?Reviewed appropriate screening tests for age  ?Also reviewed health mt list, fam hx and immunization status , as well as social and family history   ?See HPI ?Labs reviewed ?Tdap updated today ?Patient had flu shot in the fall ?Given information on the Shingrix vaccine as well as shingles, she plans to check on coverage of this ?Colonoscopy is up-to-date from 2019 and [place], we sent for the report to know when she is due for her next ?Mammogram up-to-date from 02/17/2021 and no lumps on self breast exam, plans GYN visit ?Pap was done by GYN 10 of 2021 result of ASCUS with negative HPV, she plans to follow-up with her gynecologist at [facility] clinic ?

## 2021-06-10 NOTE — Patient Instructions (Addendum)
We need to get your colonoscopy report and path report to know when you need your next one  ? ?Read about shingles and the vaccine for it (shingrix) ?If you are interested in the shingles vaccine series (Shingrix), call your insurance or pharmacy to check on coverage and location it must be given.  If affordable - you can schedule it here or at your pharmacy depending on coverage  ? ?Call [facility] clinic for your gyn visit  ? ? ?Your cholesterol is up  ?Avoid red meat/ fried foods/ egg yolks/ fatty breakfast meats/ butter, cheese and high fat dairy/ and shellfish  ?Get back on the generic crestor 5 mg every day  ?Let's re check this in 2-3 months  ? ?Use sunscreen outside ? ?Tetanus shot today  ? ?Get a B complex vitamin and take one daily  ?Get vitamin D and take 2000 iu daily  ? ? ?

## 2021-06-10 NOTE — Progress Notes (Signed)
? ?Subjective:  ? ? Patient ID: Stacey Parrish, female    DOB: 09/12/1960, 61 y.o.   MRN: [id number] ? ?This visit occurred during the SARS-CoV-2 public health emergency.  Safety protocols were in place, including screening questions prior to the visit, additional usage of staff PPE, and extensive cleaning of exam room while observing appropriate contact time as indicated for disinfecting solutions.  ? ?HPI ?Here for health maintenance exam and to review chronic medical problems   ? ?Wt Readings from Last 3 Encounters:  ?06/10/21 136 lb 4 oz (61.8 kg)  ?08/20/20 130 lb (59 kg)  ?06/11/20 133 lb 11.2 oz (60.6 kg)  ? ?30.28 kg/m? ?Feeling ok  ?Taking care of herself  ? ?On the treadmill 30 minutes per day  ? ? ?Tetanus shot: over 20 years ago ?Flu shot: In the fall  ?Zoster status : interested in shingrix  ?Covid immunized   ? ?Colonoscopy 2019 in [place]  ? ?Mammogram 12/22 ?Self breast exam : no lumps  ? ?Pap 12/2019 ascus with neg HPV  ?Menopausal  ?7 years ago  ?Still has hot flashes  ? ?Hyperlipidemia ?Lab Results  ?Component Value Date  ? CHOL 228 (H) 06/03/2021  ? CHOL 165 10/22/2020  ? CHOL 221 (H) 08/13/2020  ? ?Lab Results  ?Component Value Date  ? HDL 61.00 06/03/2021  ? HDL 65 10/22/2020  ? HDL 61.00 08/13/2020  ? ?Lab Results  ?Component Value Date  ? LDLCALC 139 (H) 06/03/2021  ? [place] 84 10/22/2020  ? LDLCALC 144 (H) 08/13/2020  ? ?Lab Results  ?Component Value Date  ? TRIG 137.0 06/03/2021  ? TRIG 69 10/22/2020  ? TRIG 78.0 08/13/2020  ? ?Lab Results  ?Component Value Date  ? CHOLHDL 4 06/03/2021  ? CHOLHDL 2.5 10/22/2020  ? CHOLHDL 4 08/13/2020  ? ?No results found for: LDLDIRECT ?Crestor 5 mg daily  ? ?Diet  ?Some fried food  ?Beef once per month  ? ? ?Other labs ?Results for orders placed or performed in visit on 06/03/21  ?TSH  ?Result Value Ref Range  ? TSH 5.01 0.35 - 5.50 uIU/mL  ?Lipid panel  ?Result Value Ref Range  ? Cholesterol 228 (H) 0 - 200 mg/dL  ? Triglycerides 137.0 0.0 - 149.0 mg/dL  ?  HDL 61.00 >39.00 mg/dL  ? VLDL 27.4 0.0 - 40.0 mg/dL  ? LDL Cholesterol 139 (H) 0 - 99 mg/dL  ? Total CHOL/HDL Ratio 4   ? NonHDL 166.50   ?Comprehensive metabolic panel  ?Result Value Ref Range  ? Sodium 138 135 - 145 mEq/L  ? Potassium 4.6 3.5 - 5.1 mEq/L  ? Chloride 100 96 - 112 mEq/L  ? CO2 30 19 - 32 mEq/L  ? Glucose, Bld 85 70 - 99 mg/dL  ? BUN 12 6 - 23 mg/dL  ? Creatinine, Ser 0.78 0.40 - 1.20 mg/dL  ? Total Bilirubin 0.4 0.2 - 1.2 mg/dL  ? Alkaline Phosphatase 87 39 - 117 U/L  ? AST 18 0 - 37 U/L  ? ALT 19 0 - 35 U/L  ? Total Protein 7.3 6.0 - 8.3 g/dL  ? Albumin 4.1 3.5 - 5.2 g/dL  ? GFR 82.87 >60.00 mL/min  ? Calcium 9.4 8.4 - 10.5 mg/dL  ?CBC with Differential/Platelet  ?Result Value Ref Range  ? WBC 7.3 4.0 - 10.5 K/uL  ? RBC 4.74 3.87 - 5.11 Mil/uL  ? Hemoglobin 13.7 12.0 - 15.0 g/dL  ? HCT 41.4 36.0 - 46.0 %  ?  MCV 87.3 78.0 - 100.0 fl  ? MCHC 33.0 30.0 - 36.0 g/dL  ? RDW 12.4 11.5 - 15.5 %  ? Platelets 320.0 150.0 - 400.0 K/uL  ? Neutrophils Relative % 62.8 43.0 - 77.0 %  ? Lymphocytes Relative 27.4 12.0 - 46.0 %  ? Monocytes Relative 8.2 3.0 - 12.0 %  ? Eosinophils Relative 1.0 0.0 - 5.0 %  ? Basophils Relative 0.6 0.0 - 3.0 %  ? Neutro Abs 4.6 1.4 - 7.7 K/uL  ? Lymphs Abs 2.0 0.7 - 4.0 K/uL  ? Monocytes Absolute 0.6 0.1 - 1.0 K/uL  ? Eosinophils Absolute 0.1 0.0 - 0.7 K/uL  ? Basophils Absolute 0.0 0.0 - 0.1 K/uL  ?  ?Patient Active Problem List  ? Diagnosis Date Noted  ? Hyperlipidemia 05/14/2020  ? Dyspepsia 05/14/2020  ? Routine general medical examination at a health care facility 03/05/2020  ? ?History reviewed. No pertinent past medical history. ?History reviewed. No pertinent surgical history. ?Social History  ? ?Tobacco Use  ? Smoking status: Never  ? Smokeless tobacco: Never  ?Substance Use Topics  ? Alcohol use: Not Currently  ? Drug use: Never  ? ?Family History  ?Problem Relation Age of Onset  ? Arthritis Mother   ? Hearing loss Mother   ? Hyperlipidemia Mother   ? Hypertension Mother    ? Alcohol abuse Father   ? ?No Known Allergies ?No current outpatient medications on file prior to visit.  ? ?No current facility-administered medications on file prior to visit.  ?  ?Review of Systems  ?Constitutional:  Negative for activity change, appetite change, fatigue, fever and unexpected weight change.  ?HENT:  Negative for congestion, ear pain, rhinorrhea, sinus pressure and sore throat.   ?Eyes:  Negative for pain, redness and visual disturbance.  ?Respiratory:  Negative for cough, shortness of breath and wheezing.   ?Cardiovascular:  Negative for chest pain and palpitations.  ?Gastrointestinal:  Negative for abdominal pain, blood in stool, constipation and diarrhea.  ?Endocrine: Negative for polydipsia and polyuria.  ?Genitourinary:  Negative for dysuria, frequency and urgency.  ?Musculoskeletal:  Negative for arthralgias, back pain and myalgias.  ?Skin:  Negative for pallor and rash.  ?Allergic/Immunologic: Negative for environmental allergies.  ?Neurological:  Negative for dizziness, syncope and headaches.  ?Hematological:  Negative for adenopathy. Does not bruise/bleed easily.  ?Psychiatric/Behavioral:  Negative for decreased concentration and dysphoric mood. The patient is not nervous/anxious.   ? ?   ?Objective:  ? Physical Exam ?Constitutional:   ?   General: She is not in acute distress. ?   Appearance: Normal appearance. She is well-developed. She is obese. She is not ill-appearing or diaphoretic.  ?HENT:  ?   Head: Normocephalic and atraumatic.  ?   Right Ear: Tympanic membrane, ear canal and external ear normal.  ?   Left Ear: Tympanic membrane, ear canal and external ear normal.  ?   Nose: Nose normal. No congestion.  ?   Mouth/Throat:  ?   Mouth: Mucous membranes are moist.  ?   Pharynx: Oropharynx is clear. No posterior oropharyngeal erythema.  ?Eyes:  ?   General: No scleral icterus. ?   Extraocular Movements: Extraocular movements intact.  ?   Conjunctiva/sclera: Conjunctivae normal.  ?    Pupils: Pupils are equal, round, and reactive to light.  ?Neck:  ?   Thyroid: No thyromegaly.  ?   Vascular: No carotid bruit or JVD.  ?Cardiovascular:  ?   Rate and Rhythm: Normal rate  and regular rhythm.  ?   Pulses: Normal pulses.  ?   Heart sounds: Normal heart sounds.  ?  No gallop.  ?Pulmonary:  ?   Effort: Pulmonary effort is normal. No respiratory distress.  ?   Breath sounds: Normal breath sounds. No wheezing.  ?   Comments: Good air exch ?Chest:  ?   Chest wall: No tenderness.  ?Abdominal:  ?   General: Bowel sounds are normal. There is no distension or abdominal bruit.  ?   Palpations: Abdomen is soft. There is no mass.  ?   Tenderness: There is no abdominal tenderness.  ?   Hernia: No hernia is present.  ?Genitourinary: ?   Comments: Breast and pelvic exam done by gyn provider    ?Musculoskeletal:     ?   General: No tenderness. Normal range of motion.  ?   Cervical back: Normal range of motion and neck supple. No rigidity. No muscular tenderness.  ?   Right lower leg: No edema.  ?   Left lower leg: No edema.  ?   Comments: No kyphosis   ?Lymphadenopathy:  ?   Cervical: No cervical adenopathy.  ?Skin: ?   General: Skin is warm and dry.  ?   Coloration: Skin is not pale.  ?   Findings: No erythema or rash.  ?   Comments: Scattered lentigines/solar  ?  ?Neurological:  ?   Mental Status: She is alert. Mental status is at baseline.  ?   Cranial Nerves: No cranial nerve deficit.  ?   Motor: No abnormal muscle tone.  ?   Coordination: Coordination normal.  ?   Gait: Gait normal.  ?   Deep Tendon Reflexes: Reflexes are normal and symmetric. Reflexes normal.  ?Psychiatric:     ?   Mood and Affect: Mood normal.     ?   Cognition and Memory: Cognition and memory normal.  ?   Comments: Son helps with interpretation  ?  ? ? ? ? ? ?   ?Assessment & Plan:  ? ?Problem List Items Addressed This Visit   ? ?  ? Other  ? Hyperlipidemia  ?  Disc goals for lipids and reasons to control them ?Rev last labs with pt ?Rev low  sat fat diet in detail ?LDL is up to 139, suspect due to missed crestor doses ?Enc to get back on track with 5 mg daily  ?Also eat less fried foods  ?  ?  ? Relevant Medications  ? rosuvastatin (CRESTOR)

## 2021-06-10 NOTE — Assessment & Plan Note (Signed)
Disc goals for lipids and reasons to control them ?Rev last labs with pt ?Rev low sat fat diet in detail ?LDL is up to 139, suspect due to missed crestor doses ?Enc to get back on track with 5 mg daily  ?Also eat less fried foods  ?

## 2021-09-09 ENCOUNTER — Encounter: Payer: Self-pay | Admitting: Family Medicine

## 2021-09-09 ENCOUNTER — Ambulatory Visit: Payer: BLUE CROSS/BLUE SHIELD | Admitting: Family Medicine

## 2021-09-09 ENCOUNTER — Telehealth: Payer: Self-pay

## 2021-09-09 VITALS — BP 118/84 | HR 78 | Ht <= 58 in | Wt 137.0 lb

## 2021-09-09 DIAGNOSIS — E7800 Pure hypercholesterolemia, unspecified: Secondary | ICD-10-CM

## 2021-09-09 LAB — LIPID PANEL
Cholesterol: 247 mg/dL — ABNORMAL HIGH (ref 0–200)
HDL: 60.8 mg/dL (ref >39.00)
LDL Cholesterol: 164 mg/dL — ABNORMAL HIGH (ref 0–99)
NonHDL: 185.95
Total CHOL/HDL Ratio: 4
Triglycerides: 109 mg/dL (ref 0.0–149.0)
VLDL: 21.8 mg/dL (ref 0.0–40.0)

## 2021-09-09 LAB — ALT: ALT: 19 U/L (ref 0–35)

## 2021-09-09 LAB — AST: AST: 19 U/L (ref 0–37)

## 2021-09-09 NOTE — Telephone Encounter (Signed)
I left a message for the patient to return my call.

## 2021-09-13 MED ORDER — ROSUVASTATIN CALCIUM 10 MG PO TABS: 10.0000 mg | ORAL_TABLET | Freq: Every day | ORAL | 3 refills | Status: DC

## 2021-11-25 ENCOUNTER — Other Ambulatory Visit (INDEPENDENT_AMBULATORY_CARE_PROVIDER_SITE_OTHER): Payer: BLUE CROSS/BLUE SHIELD

## 2021-11-25 DIAGNOSIS — E7800 Pure hypercholesterolemia, unspecified: Secondary | ICD-10-CM

## 2021-11-25 LAB — LIPID PANEL
Cholesterol: 209 mg/dL — ABNORMAL HIGH (ref 0–200)
HDL: 59.4 mg/dL (ref >39.00)
LDL Cholesterol: 126 mg/dL — ABNORMAL HIGH (ref 0–99)
NonHDL: 149.89
Total CHOL/HDL Ratio: 4
Triglycerides: 121 mg/dL (ref 0.0–149.0)
VLDL: 24.2 mg/dL (ref 0.0–40.0)

## 2021-11-25 LAB — AST: AST: 23 U/L (ref 0–37)

## 2021-11-25 LAB — ALT: ALT: 20 U/L (ref 0–35)

## 2021-11-30 ENCOUNTER — Encounter: Payer: Self-pay | Admitting: *Deleted

## 2022-02-27 DIAGNOSIS — Z1231 Encounter for screening mammogram for malignant neoplasm of breast: Secondary | ICD-10-CM | POA: Diagnosis not present

## 2022-02-27 DIAGNOSIS — R92333 Mammographic heterogeneous density, bilateral breasts: Secondary | ICD-10-CM | POA: Diagnosis not present

## 2022-03-17 DIAGNOSIS — R928 Other abnormal and inconclusive findings on diagnostic imaging of breast: Secondary | ICD-10-CM | POA: Diagnosis not present

## 2022-03-17 DIAGNOSIS — R92332 Mammographic heterogeneous density, left breast: Secondary | ICD-10-CM | POA: Diagnosis not present

## 2022-06-06 ENCOUNTER — Other Ambulatory Visit: Payer: Self-pay | Admitting: Family Medicine

## 2022-08-22 ENCOUNTER — Telehealth: Payer: Self-pay | Admitting: Family Medicine

## 2022-08-22 DIAGNOSIS — Z0000 Encounter for general adult medical examination without abnormal findings: Secondary | ICD-10-CM

## 2022-08-22 DIAGNOSIS — E7800 Pure hypercholesterolemia, unspecified: Secondary | ICD-10-CM

## 2022-08-22 NOTE — Telephone Encounter (Signed)
-----   Message from Alvina Chou sent at 08/07/2022  2:54 PM EDT ----- Regarding: lab orders for Wednesday, 6.5.24 Patient is scheduled for CPX labs, please order future labs, Thanks , Camelia Eng

## 2022-08-23 ENCOUNTER — Other Ambulatory Visit (INDEPENDENT_AMBULATORY_CARE_PROVIDER_SITE_OTHER): Payer: BLUE CROSS/BLUE SHIELD

## 2022-08-23 DIAGNOSIS — Z0000 Encounter for general adult medical examination without abnormal findings: Secondary | ICD-10-CM

## 2022-08-23 DIAGNOSIS — E7800 Pure hypercholesterolemia, unspecified: Secondary | ICD-10-CM | POA: Diagnosis not present

## 2022-08-23 LAB — CBC WITH DIFFERENTIAL/PLATELET
Basophils Absolute: 0 10*3/uL (ref 0.0–0.1)
Basophils Relative: 0.7 % (ref 0.0–3.0)
Eosinophils Absolute: 0.1 10*3/uL (ref 0.0–0.7)
Eosinophils Relative: 2.1 % (ref 0.0–5.0)
HCT: 42.8 % (ref 36.0–46.0)
Hemoglobin: 14 g/dL (ref 12.0–15.0)
Lymphocytes Relative: 23.7 % (ref 12.0–46.0)
Lymphs Abs: 1.7 10*3/uL (ref 0.7–4.0)
MCHC: 32.7 g/dL (ref 30.0–36.0)
MCV: 87.7 fl (ref 78.0–100.0)
Monocytes Absolute: 0.6 10*3/uL (ref 0.1–1.0)
Monocytes Relative: 8 % (ref 3.0–12.0)
Neutro Abs: 4.6 10*3/uL (ref 1.4–7.7)
Neutrophils Relative %: 65.5 % (ref 43.0–77.0)
Platelets: 315 10*3/uL (ref 150.0–400.0)
RBC: 4.88 Mil/uL (ref 3.87–5.11)
RDW: 12.6 % (ref 11.5–15.5)
WBC: 7.1 10*3/uL (ref 4.0–10.5)

## 2022-08-23 LAB — LIPID PANEL
Cholesterol: 219 mg/dL — ABNORMAL HIGH (ref 0–200)
HDL: 69 mg/dL (ref >39.00)
LDL Cholesterol: 137 mg/dL — ABNORMAL HIGH (ref 0–99)
NonHDL: 150.27
Total CHOL/HDL Ratio: 3
Triglycerides: 64 mg/dL (ref 0.0–149.0)
VLDL: 12.8 mg/dL (ref 0.0–40.0)

## 2022-08-23 LAB — COMPREHENSIVE METABOLIC PANEL
ALT: 24 U/L (ref 0–35)
AST: 23 U/L (ref 0–37)
Albumin: 4.4 g/dL (ref 3.5–5.2)
Alkaline Phosphatase: 92 U/L (ref 39–117)
BUN: 13 mg/dL (ref 6–23)
CO2: 23 mEq/L (ref 19–32)
Calcium: 9.4 mg/dL (ref 8.4–10.5)
Chloride: 101 mEq/L (ref 96–112)
Creatinine, Ser: 0.75 mg/dL (ref 0.40–1.20)
GFR: 86.13 mL/min (ref >60.00)
Glucose, Bld: 79 mg/dL (ref 70–99)
Potassium: 4.4 mEq/L (ref 3.5–5.1)
Sodium: 136 mEq/L (ref 135–145)
Total Bilirubin: 0.5 mg/dL (ref 0.2–1.2)
Total Protein: 8.1 g/dL (ref 6.0–8.3)

## 2022-08-23 LAB — TSH: TSH: 5.46 u[IU]/mL (ref 0.35–5.50)

## 2022-08-29 ENCOUNTER — Ambulatory Visit (INDEPENDENT_AMBULATORY_CARE_PROVIDER_SITE_OTHER): Payer: BLUE CROSS/BLUE SHIELD | Admitting: Family Medicine

## 2022-08-29 ENCOUNTER — Encounter: Payer: Self-pay | Admitting: Family Medicine

## 2022-08-29 VITALS — BP 135/70 | HR 69 | Temp 97.8°F | Ht <= 58 in | Wt 139.1 lb

## 2022-08-29 DIAGNOSIS — Z0000 Encounter for general adult medical examination without abnormal findings: Secondary | ICD-10-CM

## 2022-08-29 DIAGNOSIS — E7800 Pure hypercholesterolemia, unspecified: Secondary | ICD-10-CM | POA: Diagnosis not present

## 2022-08-29 DIAGNOSIS — Z86010 Personal history of colonic polyps: Secondary | ICD-10-CM | POA: Diagnosis not present

## 2022-08-29 DIAGNOSIS — Z1211 Encounter for screening for malignant neoplasm of colon: Secondary | ICD-10-CM | POA: Diagnosis not present

## 2022-08-29 DIAGNOSIS — R030 Elevated blood-pressure reading, without diagnosis of hypertension: Secondary | ICD-10-CM

## 2022-08-29 DIAGNOSIS — R1013 Epigastric pain: Secondary | ICD-10-CM

## 2022-08-29 MED ORDER — FAMOTIDINE 20 MG PO TABS: 20.0000 mg | ORAL_TABLET | Freq: Two times a day (BID) | ORAL | 3 refills | Status: DC

## 2022-08-29 MED ORDER — ROSUVASTATIN CALCIUM 10 MG PO TABS: 10.0000 mg | ORAL_TABLET | Freq: Every day | ORAL | 3 refills | Status: DC

## 2022-08-29 NOTE — Assessment & Plan Note (Signed)
Disc goals for lipids and reasons to control them Rev last labs with pt Rev low sat fat diet in detail Crestor 10 mg daily - ran out of and did not ask for refill  LDL up to 137  Will start back on medication and come back for fasting labs in 6 weeks

## 2022-08-29 NOTE — Assessment & Plan Note (Signed)
GI referral done for 5 y recall colonoscopy  Last one was in [place] -polyps per pt  Called for but was unable to get a report unfortunately

## 2022-08-29 NOTE — Assessment & Plan Note (Signed)
Due for 5 y colonoscopy  Referral done  

## 2022-08-29 NOTE — Assessment & Plan Note (Signed)
This was better on 2nd check BP: 135/70  Will watch Has fam h/o HTN   Discussed lifestyle efforts to help bp Given handout for DASH eating plan  Encouraged to work on weight loss and engage in regular exercise  More than likely will need medication in future

## 2022-08-29 NOTE — Progress Notes (Signed)
Subjective:    Patient ID: Stacey Parrish, female    DOB: [birth year], 62 y.o.   MRN: [id number]  HPI  Here for health maintenance exam and to review chronic medical problems   Wt Readings from Last 3 Encounters:  08/29/22 139 lb 2 oz (63.1 kg)  09/09/21 137 lb (62.1 kg)  06/10/21 136 lb 4 oz (61.8 kg)   30.64 kg/m  Vitals:   08/29/22 0856 08/29/22 0928  BP: (!) 144/76 135/70  Pulse: 69   Temp: 97.8 F (36.6 C)   SpO2: 99%    Has had some fleeting sharp pains under L breast  No shortness of breath   Immunization History  Administered Date(s) Administered   Influenza-Unspecified 02/05/2020   PFIZER(Purple Top)SARS-COV-2 Vaccination 06/20/2019, 07/15/2019   Tdap 06/10/2021    Health Maintenance Due  Topic Date Due   HIV Screening  Never done   Hepatitis C Screening  Never done   Colonoscopy  Never done   Zoster Vaccines- Shingrix (1 of 2) Never done   Colon cancer screening - wants to schedule a colonoscopy   Shingrix vaccine -interested in   Pap 12/2019  ascus with neg HPV   Colonoscopy 07/2017 in [place] -told she had 5 year recall for polyp   Gyn care - [place] / had a visit for pap -had in October     Mammogram 03/17/2022   - does this with mobile unit (then had to co a call back -neg at [facility])  Self breast exam : no lumps/has exams at [facility] gyn    Bone health  Falls: none  Fractures:none  Supplements -none - plans to start  Exercise -tries to go for some walks    Mood    09/09/2021    8:52 AM 06/10/2021    4:40 PM 03/05/2020   12:42 PM  Depression screen PHQ 2/9  Decreased Interest 0 0 0  Down, Depressed, Hopeless 0 0 0  PHQ - 2 Score 0 0 0  Altered sleeping  0   Tired, decreased energy  0   Change in appetite  0   Feeling bad or failure about yourself   0   Trouble concentrating  0   Moving slowly or fidgety/restless  0   Suicidal thoughts  0   PHQ-9 Score  0   Difficult doing work/chores  Not difficult at all     GERD/dyspepsia  No  heartburn Has had some left upper abd pain  Pepcid 20 mg bid prn-not often   Hyperlipidemia  Lab Results  Component Value Date   CHOL 219 (H) 08/23/2022   CHOL 209 (H) 11/25/2021   CHOL 247 (H) 09/09/2021   Lab Results  Component Value Date   HDL 69.00 08/23/2022   HDL 59.40 11/25/2021   HDL 60.80 09/09/2021   Lab Results  Component Value Date   LDLCALC 137 (H) 08/23/2022   LDLCALC 126 (H) 11/25/2021   LDLCALC 164 (H) 09/09/2021   Lab Results  Component Value Date   TRIG 64.0 08/23/2022   TRIG 121.0 11/25/2021   TRIG 109.0 09/09/2021   Lab Results  Component Value Date   CHOLHDL 3 08/23/2022   CHOLHDL 4 11/25/2021   CHOLHDL 4 09/09/2021   No results found for: "LDLDIRECT" Taking crestor 10 mg daily - off of it , ran out and did not call for a refill Did not know it was long term  Pharmacy did not send   Other labs CMP  Component Value Date/Time   NA 136 08/23/2022 0842   K 4.4 08/23/2022 0842   CL 101 08/23/2022 0842   CO2 23 08/23/2022 0842   GLUCOSE 79 08/23/2022 0842   BUN 13 08/23/2022 0842   CREATININE 0.75 08/23/2022 0842   CALCIUM 9.4 08/23/2022 0842   PROT 8.1 08/23/2022 0842   ALBUMIN 4.4 08/23/2022 0842   AST 23 08/23/2022 0842   ALT 24 08/23/2022 0842   ALKPHOS 92 08/23/2022 0842   BILITOT 0.5 08/23/2022 0842   GFR 86.13 08/23/2022 0842   Lab Results  Component Value Date   WBC 7.1 08/23/2022   HGB 14.0 08/23/2022   HCT 42.8 08/23/2022   MCV 87.7 08/23/2022   PLT 315.0 08/23/2022   Lab Results  Component Value Date   TSH 5.46 08/23/2022    Patient Active Problem List   Diagnosis Date Noted   Colon cancer screening 08/29/2022   History of colon polyps 08/29/2022   Elevated BP without diagnosis of hypertension 08/29/2022   Hyperlipidemia 05/14/2020   Dyspepsia 05/14/2020   Routine general medical examination at a health care facility 03/05/2020   History reviewed. No pertinent past medical history. History reviewed. No  pertinent surgical history. Social History   Tobacco Use   Smoking status: Never   Smokeless tobacco: Never  Substance Use Topics   Alcohol use: Not Currently   Drug use: Never   Family History  Problem Relation Age of Onset   Arthritis Mother    Hearing loss Mother    Hyperlipidemia Mother    Hypertension Mother    Alcohol abuse Father    No Known Allergies No current outpatient medications on file prior to visit.   No current facility-administered medications on file prior to visit.     Review of Systems  Constitutional:  Negative for activity change, appetite change, fatigue, fever and unexpected weight change.  HENT:  Negative for congestion, ear pain, rhinorrhea, sinus pressure and sore throat.   Eyes:  Negative for pain, redness and visual disturbance.  Respiratory:  Negative for cough, shortness of breath and wheezing.   Cardiovascular:  Negative for chest pain and palpitations.  Gastrointestinal:  Positive for abdominal pain. Negative for abdominal distention, blood in stool, constipation and diarrhea.       Occ sharp LUQ abd discomfort  None today  No heartburn  Endocrine: Negative for polydipsia and polyuria.  Genitourinary:  Negative for dysuria, frequency and urgency.  Musculoskeletal:  Negative for arthralgias, back pain and myalgias.  Skin:  Negative for pallor and rash.  Allergic/Immunologic: Negative for environmental allergies.  Neurological:  Negative for dizziness, syncope and headaches.  Hematological:  Negative for adenopathy. Does not bruise/bleed easily.  Psychiatric/Behavioral:  Negative for decreased concentration and dysphoric mood. The patient is not nervous/anxious.        Objective:   Physical Exam Constitutional:      General: She is not in acute distress.    Appearance: Normal appearance. She is well-developed. She is obese. She is not ill-appearing or diaphoretic.  HENT:     Head: Normocephalic and atraumatic.     Right Ear: Tympanic  membrane, ear canal and external ear normal.     Left Ear: Tympanic membrane, ear canal and external ear normal.     Nose: Nose normal. No congestion.     Mouth/Throat:     Mouth: Mucous membranes are moist.     Pharynx: Oropharynx is clear. No posterior oropharyngeal erythema.  Eyes:  General: No scleral icterus.    Extraocular Movements: Extraocular movements intact.     Conjunctiva/sclera: Conjunctivae normal.     Pupils: Pupils are equal, round, and reactive to light.  Neck:     Thyroid: No thyromegaly.     Vascular: No carotid bruit or JVD.  Cardiovascular:     Rate and Rhythm: Normal rate and regular rhythm.     Pulses: Normal pulses.     Heart sounds: Normal heart sounds.     No gallop.  Pulmonary:     Effort: Pulmonary effort is normal. No respiratory distress.     Breath sounds: Normal breath sounds. No wheezing.     Comments: Good air exch Chest:     Chest wall: No tenderness.  Abdominal:     General: Bowel sounds are normal. There is no distension or abdominal bruit.     Palpations: Abdomen is soft. There is no mass.     Tenderness: There is no abdominal tenderness. There is no guarding or rebound.     Hernia: No hernia is present.  Genitourinary:    Comments: Breast and pelvic exam done by gyn provider   Musculoskeletal:        General: No tenderness. Normal range of motion.     Cervical back: Normal range of motion and neck supple. No rigidity. No muscular tenderness.     Right lower leg: No edema.     Left lower leg: No edema.     Comments: No kyphosis   Lymphadenopathy:     Cervical: No cervical adenopathy.  Skin:    General: Skin is warm and dry.     Coloration: Skin is not pale.     Findings: No erythema or rash.  Neurological:     Mental Status: She is alert. Mental status is at baseline.     Cranial Nerves: No cranial nerve deficit.     Motor: No abnormal muscle tone.     Coordination: Coordination normal.     Gait: Gait normal.     Deep Tendon  Reflexes: Reflexes are normal and symmetric. Reflexes normal.  Psychiatric:        Mood and Affect: Mood normal.        Cognition and Memory: Cognition and memory normal.           Assessment & Plan:   Problem List Items Addressed This Visit       Other   Routine general medical examination at a health care facility - Primary    Reviewed health habits including diet and exercise and skin cancer prevention Reviewed appropriate screening tests for age  Also reviewed health mt list, fam hx and immunization status , as well as social and family history   See HPI Labs reviewed and ordered GI referral done for 5 y recall colonoscopy (last one done in [place] and polyps) Pap sent for from gyn this oct (prior ascus with neg HPV 12/2019)  Discussed ca and D and exercise for bone health /encouraged strength building exercise  PHQ score of 0      Hyperlipidemia    Disc goals for lipids and reasons to control them Rev last labs with pt Rev low sat fat diet in detail Crestor 10 mg daily - ran out of and did not ask for refill  LDL up to 137  Will start back on medication and come back for fasting labs in 6 weeks       Relevant Medications   rosuvastatin (  CRESTOR) 10 MG tablet   Other Relevant Orders   Lipid panel   ALT   AST   History of colon polyps    Due for 5 y colonoscopy  Referral done       Relevant Orders   Ambulatory referral to Gastroenterology   Elevated BP without diagnosis of hypertension    This was better on 2nd check BP: 135/70  Will watch Has fam h/o HTN   Discussed lifestyle efforts to help bp Given handout for DASH eating plan  Encouraged to work on weight loss and engage in regular exercise  More than likely will need medication in future       Dyspepsia    Pt is off pepcid Has occ LUQ abd pain / fleeting Instructed to start back on it  F/u if not improved to w/u this symptom ER precautions noted       Colon cancer screening    GI referral  done for 5 y recall colonoscopy  Last one was in [place] -polyps per pt  Called for but was unable to get a report unfortunately      Relevant Orders   Ambulatory referral to Gastroenterology

## 2022-08-29 NOTE — Assessment & Plan Note (Signed)
Pt is off pepcid Has occ LUQ abd pain / fleeting Instructed to start back on it  F/u if not improved to w/u this symptom ER precautions noted

## 2022-08-29 NOTE — Patient Instructions (Addendum)
If you are interested in the shingles vaccine series (Shingrix), call your insurance or pharmacy to check on coverage and location it must be given.  If affordable - you can schedule it here or at your pharmacy depending on coverage   To keep bone strong  Try to get 1200-1500 mg of calcium per day with at least 2000 iu of vitamin D - for bone health Divide it twice daily    You may need medicine for blood pressure in the future  Try to get most of your carbohydrates from produce (with the exception of white potatoes)  Eat less bread/pasta/rice/snack foods/cereals/sweets and other items from the middle of the grocery store (processed carbs)    Call to schedule your colonoscopy : I put the referral in  [facility]  [phone]   Keep walking for exercise  Add some strength training to your routine, this is important for bone and brain health and can reduce your risk of falls and help your body use insulin properly and regulate weight  Light weights, exercise bands , and internet videos are a good way to start  Yoga (chair or regular), machines , floor exercises or a gym with machines are also good options   Try to add this at least 3 times per week   Try getting back on pepcid for a month to see if this stops the side pain  If not , please come back in for a visit   Get back on cholesterol medicine  Let's schedule fasting labs in 6 weeks

## 2022-08-29 NOTE — Assessment & Plan Note (Signed)
Reviewed health habits including diet and exercise and skin cancer prevention Reviewed appropriate screening tests for age  Also reviewed health mt list, fam hx and immunization status , as well as social and family history   See HPI Labs reviewed and ordered GI referral done for 5 y recall colonoscopy (last one done in [place] and polyps) Pap sent for from gyn this oct (prior ascus with neg HPV 12/2019)  Discussed ca and D and exercise for bone health /encouraged strength building exercise  PHQ score of 0

## 2022-08-31 ENCOUNTER — Encounter: Payer: Self-pay | Admitting: Internal Medicine

## 2022-09-26 ENCOUNTER — Encounter: Payer: Self-pay | Admitting: Internal Medicine

## 2022-09-26 ENCOUNTER — Ambulatory Visit (AMBULATORY_SURGERY_CENTER): Payer: BLUE CROSS/BLUE SHIELD

## 2022-09-26 VITALS — Ht <= 58 in | Wt 138.0 lb

## 2022-09-26 DIAGNOSIS — Z1211 Encounter for screening for malignant neoplasm of colon: Secondary | ICD-10-CM

## 2022-09-26 MED ORDER — NA SULFATE-K SULFATE-MG SULF 17.5-3.13-1.6 GM/177ML PO SOLN
1.0000 | Freq: Once | ORAL | 0 refills | Status: AC
Start: 2022-09-26 — End: 2022-09-26

## 2022-09-26 NOTE — Progress Notes (Signed)

## 2022-10-10 ENCOUNTER — Other Ambulatory Visit (INDEPENDENT_AMBULATORY_CARE_PROVIDER_SITE_OTHER): Payer: BLUE CROSS/BLUE SHIELD

## 2022-10-10 DIAGNOSIS — E7800 Pure hypercholesterolemia, unspecified: Secondary | ICD-10-CM | POA: Diagnosis not present

## 2022-10-10 LAB — LIPID PANEL
Cholesterol: 155 mg/dL (ref 0–200)
HDL: 57.4 mg/dL (ref >39.00)
LDL Cholesterol: 81 mg/dL (ref 0–99)
NonHDL: 97.23
Total CHOL/HDL Ratio: 3
Triglycerides: 79 mg/dL (ref 0.0–149.0)
VLDL: 15.8 mg/dL (ref 0.0–40.0)

## 2022-10-10 LAB — AST: AST: 17 U/L (ref 0–37)

## 2022-10-10 LAB — ALT: ALT: 16 U/L (ref 0–35)

## 2022-10-11 ENCOUNTER — Ambulatory Visit (AMBULATORY_SURGERY_CENTER): Payer: BLUE CROSS/BLUE SHIELD | Admitting: Internal Medicine

## 2022-10-11 ENCOUNTER — Encounter: Payer: Self-pay | Admitting: *Deleted

## 2022-10-11 ENCOUNTER — Encounter: Payer: Self-pay | Admitting: Internal Medicine

## 2022-10-11 VITALS — BP 114/59 | HR 60 | Temp 97.7°F | Resp 14 | Ht <= 58 in | Wt 138.0 lb

## 2022-10-11 DIAGNOSIS — D123 Benign neoplasm of transverse colon: Secondary | ICD-10-CM | POA: Diagnosis not present

## 2022-10-11 DIAGNOSIS — D122 Benign neoplasm of ascending colon: Secondary | ICD-10-CM | POA: Diagnosis not present

## 2022-10-11 DIAGNOSIS — Z1211 Encounter for screening for malignant neoplasm of colon: Secondary | ICD-10-CM

## 2022-10-11 DIAGNOSIS — K6389 Other specified diseases of intestine: Secondary | ICD-10-CM

## 2022-10-11 DIAGNOSIS — D124 Benign neoplasm of descending colon: Secondary | ICD-10-CM | POA: Diagnosis not present

## 2022-10-11 MED ORDER — SODIUM CHLORIDE 0.9 % IV SOLN
500.0000 mL | INTRAVENOUS | Status: DC
Start: 2022-10-11 — End: 2022-10-11

## 2022-10-11 NOTE — Progress Notes (Signed)
Report to PACU, RN, vss, BBS= Clear.  

## 2022-10-11 NOTE — Patient Instructions (Signed)
-  Handout on polyps, hemorrhoids provided -await pathology results -repeat colonoscopy for surveillance recommended. Date to be determined when pathology result become available   -Continue present medications    YOU HAD AN ENDOSCOPIC PROCEDURE TODAY AT THE [facility]:   Refer to the procedure report that was given to you for any specific questions about what was found during the examination.  If the procedure report does not answer your questions, please call your gastroenterologist to clarify.  If you requested that your care partner not be given the details of your procedure findings, then the procedure report has been included in a sealed envelope for you to review at your convenience later.  YOU SHOULD EXPECT: Some feelings of bloating in the abdomen. Passage of more gas than usual.  Walking can help get rid of the air that was put into your GI tract during the procedure and reduce the bloating. If you had a lower endoscopy (such as a colonoscopy or flexible sigmoidoscopy) you may notice spotting of blood in your stool or on the toilet paper. If you underwent a bowel prep for your procedure, you may not have a normal bowel movement for a few days.  Please Note:  You might notice some irritation and congestion in your nose or some drainage.  This is from the oxygen used during your procedure.  There is no need for concern and it should clear up in a day or so.  SYMPTOMS TO REPORT IMMEDIATELY:  Following lower endoscopy (colonoscopy or flexible sigmoidoscopy):  Excessive amounts of blood in the stool  Significant tenderness or worsening of abdominal pains  Swelling of the abdomen that is new, acute  Fever of 100F or higher  For urgent or emergent issues, a gastroenterologist can be reached at any hour by calling [phone]. Do not use MyChart messaging for urgent concerns.    DIET:  We do recommend a small meal at first, but then you may proceed to your regular diet.   Drink plenty of fluids but you should avoid alcoholic beverages for 24 hours.  ACTIVITY:  You should plan to take it easy for the rest of today and you should NOT DRIVE or use heavy machinery until tomorrow (because of the sedation medicines used during the test).    FOLLOW UP: Our staff will call the number listed on your records the next business day following your procedure.  We will call around 7:15- 8:00 am to check on you and address any questions or concerns that you may have regarding the information given to you following your procedure. If we do not reach you, we will leave a message.     If any biopsies were taken you will be contacted by phone or by letter within the next 1-3 weeks.  Please call us at [phone] if you have not heard about the biopsies in 3 weeks.    SIGNATURES/CONFIDENTIALITY: You and/or your care partner have signed paperwork which will be entered into your electronic medical record.  These signatures attest to the fact that that the information above on your After Visit Summary has been reviewed and is understood.  Full responsibility of the confidentiality of this discharge information lies with you and/or your care-partner.  

## 2022-10-11 NOTE — Progress Notes (Signed)
GASTROENTEROLOGY PROCEDURE H&P NOTE   Primary Care Physician: Tower, Audrie Gallus, MD    Reason for Procedure:   Colon cancer screening, family history of colon cancer  Plan:    Colonoscopy  Patient is appropriate for endoscopic procedure(s) in the ambulatory ([facility]) setting.  The nature of the procedure, as well as the risks, benefits, and alternatives were carefully and thoroughly reviewed with the patient. Ample time for discussion and questions allowed. The patient understood, was satisfied, and agreed to proceed.     HPI: Stacey Parrish is a 62 y.o. female who presents for colonoscopy for colon cancer screening and family history of colon cancer. Denies blood in stools. Father had colon cancer in his 56s. Last colonoscopy was 5 years ago and she is not sure what is showed.  Past Medical History:  Diagnosis Date   Allergy    GERD (gastroesophageal reflux disease)    Hyperlipidemia     History reviewed. No pertinent surgical history.  Prior to Admission medications   Medication Sig Start Date End Date Taking? Authorizing Provider  famotidine (PEPCID) 20 MG tablet Take 1 tablet (20 mg total) by mouth 2 (two) times daily. 08/29/22  Yes Tower, Audrie Gallus, MD  rosuvastatin (CRESTOR) 10 MG tablet Take 1 tablet (10 mg total) by mouth daily. 08/29/22  Yes Tower, Audrie Gallus, MD    Current Outpatient Medications  Medication Sig Dispense Refill   famotidine (PEPCID) 20 MG tablet Take 1 tablet (20 mg total) by mouth 2 (two) times daily. 180 tablet 3   rosuvastatin (CRESTOR) 10 MG tablet Take 1 tablet (10 mg total) by mouth daily. 90 tablet 3   Current Facility-Administered Medications  Medication Dose Route Frequency Provider Last Rate Last Admin   0.9 %  sodium chloride infusion  500 mL Intravenous Continuous Imogene Burn, MD        Allergies as of 10/11/2022   (No Known Allergies)    Family History  Problem Relation Age of Onset   Arthritis Mother    Hearing loss Mother     Hyperlipidemia Mother    Hypertension Mother    Colon cancer Father    Alcohol abuse Father    Esophageal cancer Neg Hx    Stomach cancer Neg Hx    Rectal cancer Neg Hx     Social History   Socioeconomic History   Marital status: Single    Spouse name: Not on file   Number of children: Not on file   Years of education: Not on file   Highest education level: Not on file  Occupational History   Not on file  Tobacco Use   Smoking status: Never   Smokeless tobacco: Never  Substance and Sexual Activity   Alcohol use: Not Currently   Drug use: Never   Sexual activity: Yes    Birth control/protection: Post-menopausal  Other Topics Concern   Not on file  Social History Narrative   Not on file   Social Determinants of Health   Financial Resource Strain: Not on file  Food Insecurity: Not on file  Transportation Needs: Not on file  Physical Activity: Not on file  Stress: Not on file  Social Connections: Unknown (08/02/2021)   [facility]   Social Network    Social Network: Not on file  Intimate Partner Violence: Unknown (06/24/2021)   Received from [facility]   HITS    Physically Hurt: Not on file    Insult or Talk Down To:  Not on file    Threaten Physical Harm: Not on file    Scream or Curse: Not on file    Physical Exam: Vital signs in last 24 hours: BP (!) 155/81   Pulse 70   Temp 97.7 F (36.5 C) (Temporal)   Ht 4\' 8"  (1.422 m)   Wt 138 lb (62.6 kg)   SpO2 98%   BMI 30.94 kg/m  GEN: NAD EYE: Sclerae anicteric ENT: MMM CV: Non-tachycardic Pulm: No increased work of breathing GI: Soft, NT/ND NEURO:  Alert & Oriented   Eulah Pont, MD [facility]  10/11/2022 9:19 AM

## 2022-10-11 NOTE — Progress Notes (Signed)
Called to room to assist during endoscopic procedure.  Patient ID and intended procedure confirmed with present staff. Received instructions for my participation in the procedure from the performing physician.  

## 2022-10-11 NOTE — Op Note (Signed)
[facility] Patient Name: Stacey Parrish Procedure Date: 10/11/2022 9:44 AM MRN: [id number] Endoscopist: Particia Lather , , [id number] Age: 62 Referring MD:  Date of Birth: Nov 20, 1960 Gender: Female Account #: [id number] Procedure:                Colonoscopy Indications:              Screening patient at increased risk: Family history                            of 1st-degree relative with colorectal cancer at                            age 110 years (or older) Medicines:                Monitored Anesthesia Care Procedure:                Pre-Anesthesia Assessment:                           - Prior to the procedure, a History and Physical                            was performed, and patient medications and                            allergies were reviewed. The patient's tolerance of                            previous anesthesia was also reviewed. The risks                            and benefits of the procedure and the sedation                            options and risks were discussed with the patient.                            All questions were answered, and informed consent                            was obtained. Prior Anticoagulants: The patient has                            taken no anticoagulant or antiplatelet agents. ASA                            Grade Assessment: II - A patient with mild systemic                            disease. After reviewing the risks and benefits,                            the patient was deemed in satisfactory condition to  undergo the procedure.                           After obtaining informed consent, the colonoscope                            was passed under direct vision. Throughout the                            procedure, the patient's blood pressure, pulse, and                            oxygen saturations were monitored continuously. The                            Olympus CF-HQ190L  [id number]) Colonoscope was                            introduced through the anus and advanced to the the                            terminal ileum. The colonoscopy was performed                            without difficulty. The patient tolerated the                            procedure well. The quality of the bowel                            preparation was excellent. The terminal ileum,                            ileocecal valve, appendiceal orifice, and rectum                            were photographed. Scope In: 9:50:05 AM Scope Out: 10:03:50 AM Scope Withdrawal Time: 0 hours 10 minutes 32 seconds  Total Procedure Duration: 0 hours 13 minutes 45 seconds  Findings:                 The terminal ileum appeared normal.                           A diffuse area of melanosis was found in the entire                            colon.                           Four sessile polyps were found in the transverse                            colon and ascending colon. The polyps were 3 to 5  mm in size. These polyps were removed with a cold                            snare. Resection and retrieval were complete.                           Non-bleeding internal hemorrhoids were found during                            retroflexion. Complications:            No immediate complications. Estimated Blood Loss:     Estimated blood loss was minimal. Impression:               - The examined portion of the ileum was normal.                           - Melanosis in the colon.                           - Four 3 to 5 mm polyps in the transverse colon and                            in the ascending colon, removed with a cold snare.                            Resected and retrieved.                           - Non-bleeding internal hemorrhoids. Recommendation:           - Discharge patient to home (with escort).                           - Await pathology results.                            - The findings and recommendations were discussed                            with the patient. Dr Particia Lather "[place]" [place],  10/11/2022 10:08:26 AM

## 2022-10-11 NOTE — Progress Notes (Signed)
Pt's states no medical or surgical changes since previsit or office visit.   Pt interviewed with assistance from [place], Khmer interpreter.

## 2022-10-12 ENCOUNTER — Telehealth: Payer: Self-pay

## 2022-10-12 NOTE — Telephone Encounter (Signed)
  Follow up Call-     10/11/2022    8:37 AM  Call back number  Post procedure Call Back phone  # [phone]  Permission to leave phone message Yes     Patient questions:  Do you have a fever, pain , or abdominal swelling? No. Pain Score  0 *  Have you tolerated food without any problems? Yes.    Have you been able to return to your normal activities? Yes.    Do you have any questions about your discharge instructions: Diet   No. Medications  No. Follow up visit  No.  Do you have questions or concerns about your Care? No.  Actions: * If pain score is 4 or above: No action needed, pain <4.

## 2022-10-17 ENCOUNTER — Encounter: Payer: Self-pay | Admitting: Internal Medicine

## 2023-01-17 DIAGNOSIS — Z23 Encounter for immunization: Secondary | ICD-10-CM | POA: Diagnosis not present

## 2023-02-20 IMAGING — CT CT ABDOMEN WO/W CM
4 of 13 series · 12 of 46 positions shown, 18 images · IV contrast (omnipaque)
Comparison: Renal ultrasound 06/24/2020

CLINICAL DATA: Renal mass on ultrasound.

EXAM:
CT ABDOMEN WITHOUT AND WITH CONTRAST
TECHNIQUE: Multidetector CT imaging of the abdomen was performed following the
standard protocol before and following the bolus administration of
intravenous contrast.
CONTRAST:  100mL OMNIPAQUE IOHEXOL 300 MG/ML  SOLN

[Series 2: axial without renal without 2.00 · axial · non-contrast · 0.59mm/px · z∈[-1231,-1169]mm · 2 of 124 slices shown]
[im 31/124  soft-tissue]
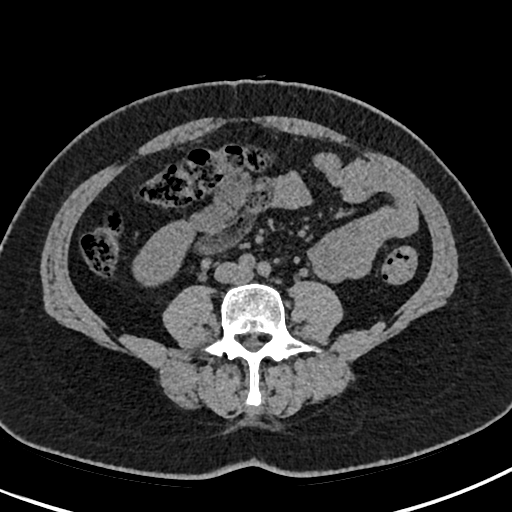
[im 62/124  soft-tissue]
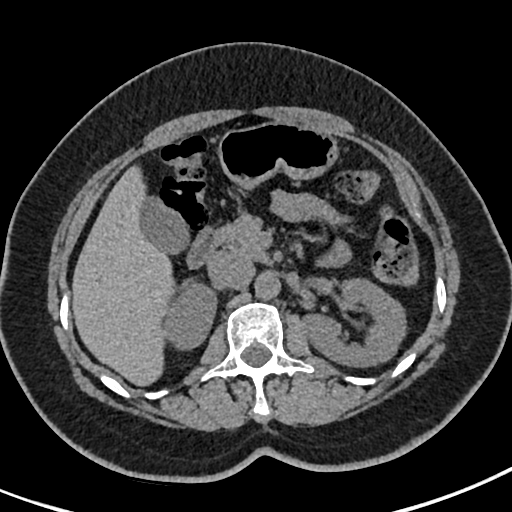

[Series 4: coronal without renal without 2.00 cor · coronal · non-contrast · 0.49mm/px · 2 of 132 slices shown, 3 images]
[im 44/132  soft-tissue]
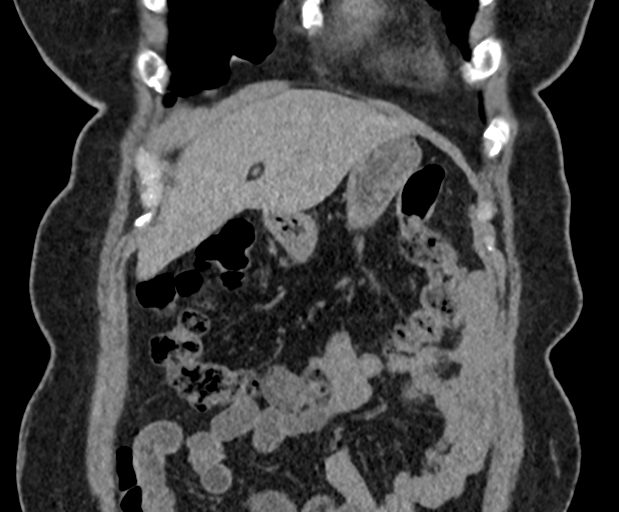
[im 44/132  bone]
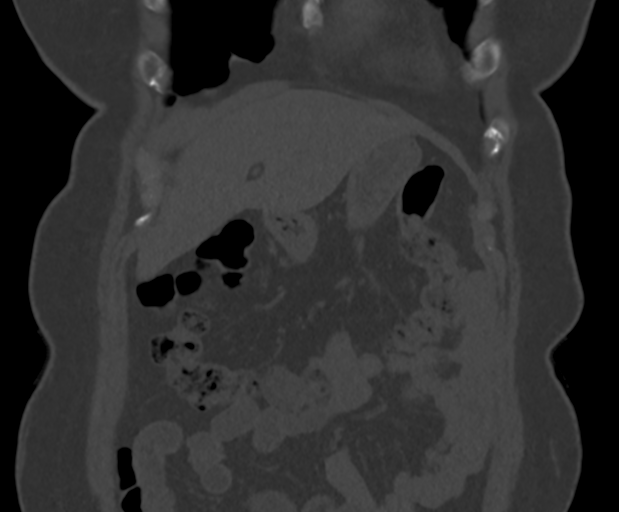
[im 88/132  soft-tissue]
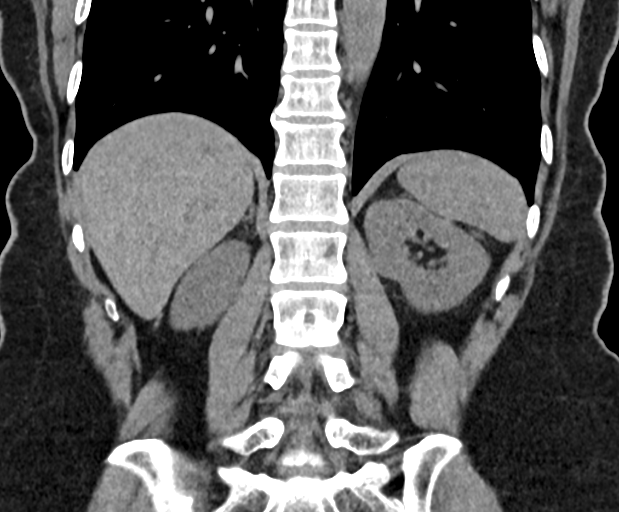

[Series 8: axial arterial renal arterial 2.00 · axial · arterial · 0.59mm/px · z∈[-1243,-1095]mm · 4 of 124 slices shown, 9 images]
[im 25/124  soft-tissue]
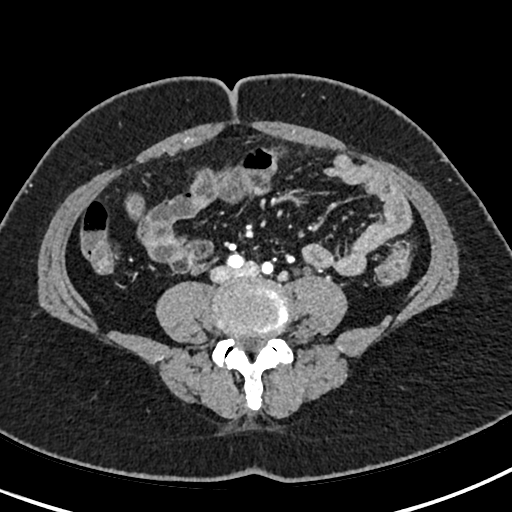
[im 25/124  lung]
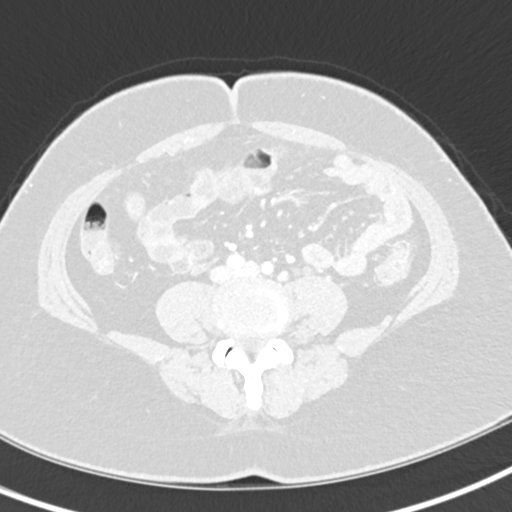
[im 25/124  bone]
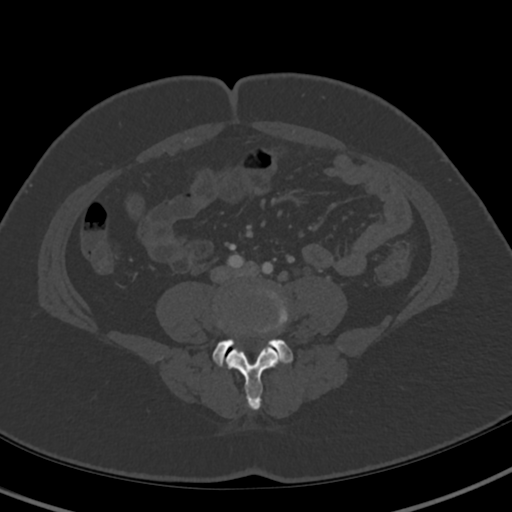
[im 50/124  soft-tissue]
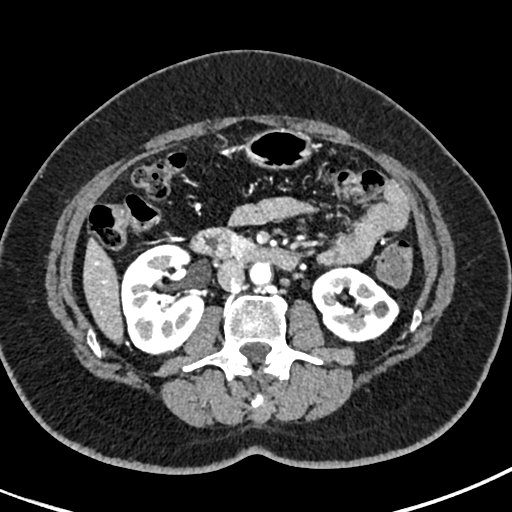
[im 50/124  lung]
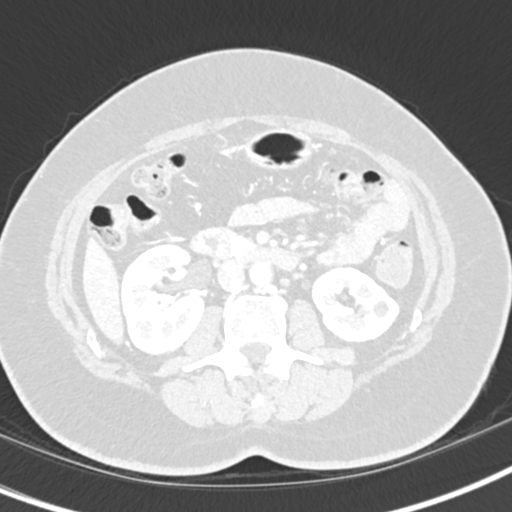
[im 74/124  soft-tissue]
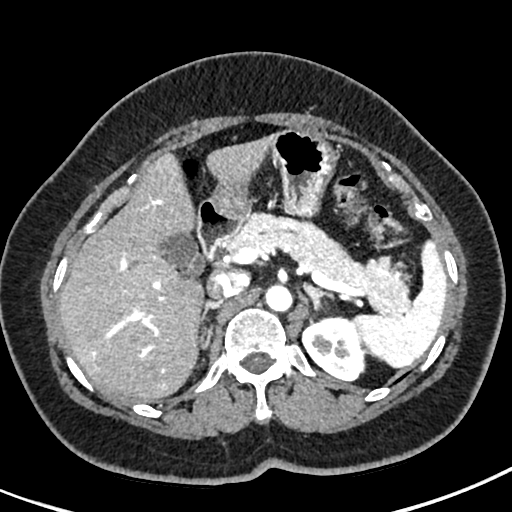
[im 74/124  lung]
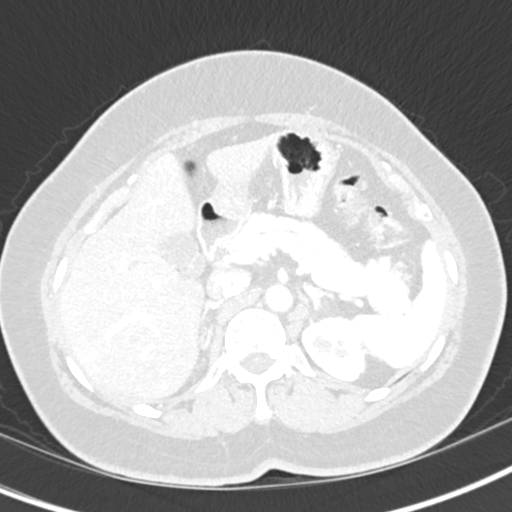
[im 99/124  soft-tissue]
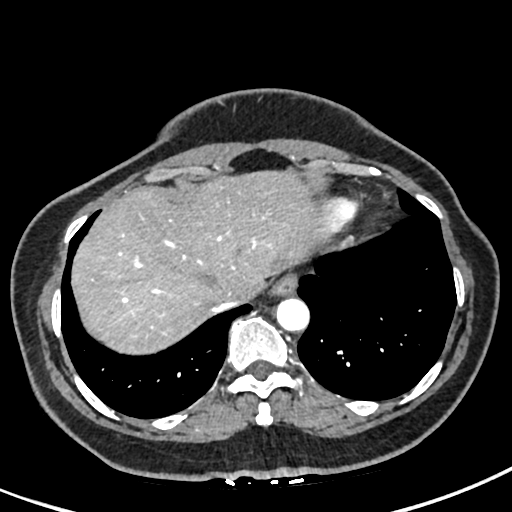
[im 99/124  lung]
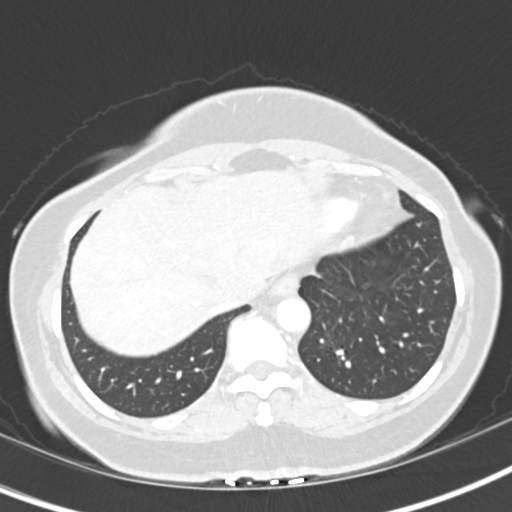

[Series 14: axial nephrographic renal nephrographic 2.00 · axial · 0.59mm/px · z∈[-1243,-1095]mm · 4 of 124 slices shown]
[im 25/124  soft-tissue]
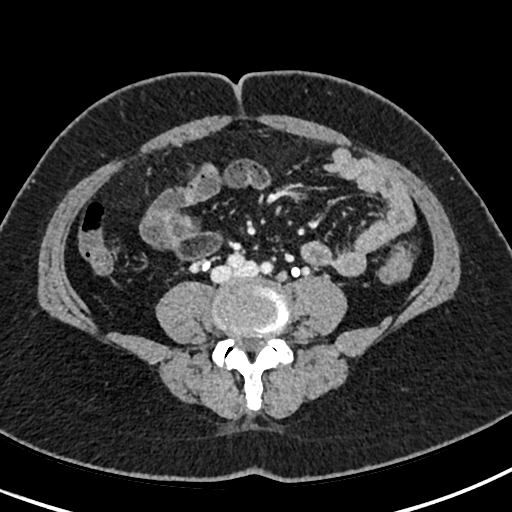
[im 50/124  soft-tissue]
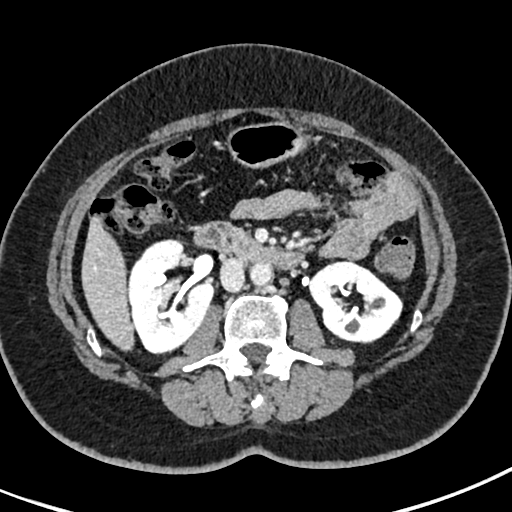
[im 74/124  soft-tissue]
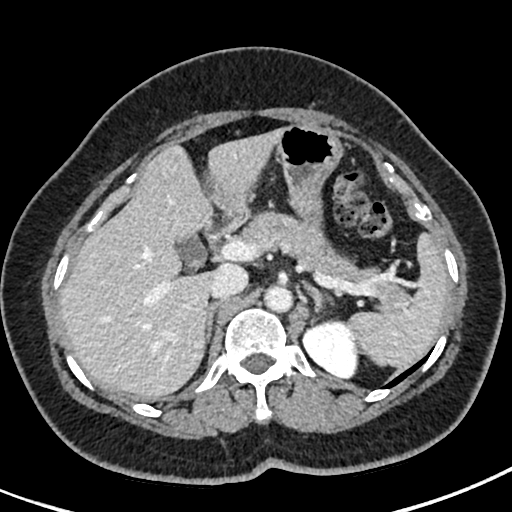
[im 99/124  soft-tissue]
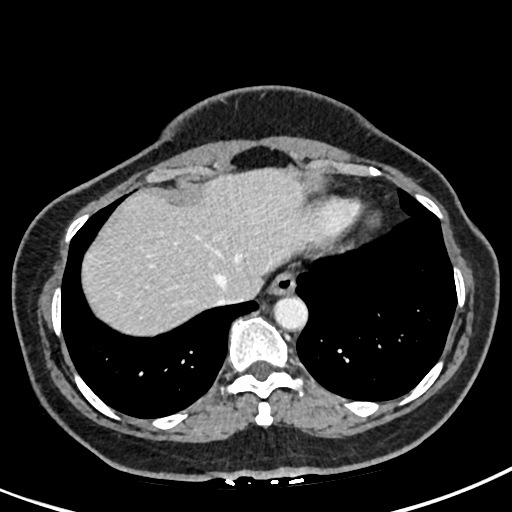

[12 of 46 positions shown; findings below may reference images not displayed]

FINDINGS: Lower chest: Lung bases are clear. Heart size normal. No pericardial
or pleural effusion. Distal esophagus is unremarkable.

Hepatobiliary: Low-attenuation lesions with peripheral contrast
puddling in the right hepatic lobe measure up to 1.5 cm, indicative
of hemangiomas. Focal blush of subcentimeter hyperattenuation in the
right hepatic lobe (14/45), likely a flash fill hemangioma. Liver
and gallbladder are unremarkable. No biliary ductal dilatation.

Pancreas: Negative.

Spleen: Negative.

Adrenals/Urinary Tract: Adrenal glands are unremarkable. Punctate
stones in the kidneys bilaterally. Low-attenuation lesions in the
kidneys measure up to 10 mm and are indicative of cysts. There are
no fat density or enhancing lesions in the kidneys. Kidneys are
otherwise unremarkable.

Stomach/Bowel: Stomach and visualized portions of the small bowel
and colon are unremarkable.

Vascular/Lymphatic: Vascular structures are unremarkable. No
pathologically enlarged lymph nodes.

Other: No free fluid.  Mesenteries and peritoneum are unremarkable.

Musculoskeletal: Degenerative changes in the spine. No worrisome
lytic or sclerotic lesions.
IMPRESSION: 1. Bilateral renal cysts. No enhancing fat density lesions in the
kidneys.
2. Punctate stones in the kidneys bilaterally.

## 2023-03-02 DIAGNOSIS — Z1231 Encounter for screening mammogram for malignant neoplasm of breast: Secondary | ICD-10-CM | POA: Diagnosis not present

## 2023-03-02 DIAGNOSIS — R92323 Mammographic fibroglandular density, bilateral breasts: Secondary | ICD-10-CM | POA: Diagnosis not present

## 2023-03-02 LAB — HM MAMMOGRAPHY

## 2023-03-06 ENCOUNTER — Encounter: Payer: Self-pay | Admitting: Family Medicine

## 2023-08-24 ENCOUNTER — Other Ambulatory Visit: Payer: Self-pay | Admitting: Family Medicine

## 2023-09-06 ENCOUNTER — Ambulatory Visit (INDEPENDENT_AMBULATORY_CARE_PROVIDER_SITE_OTHER): Admitting: Family Medicine

## 2023-09-06 ENCOUNTER — Ambulatory Visit: Payer: Self-pay | Admitting: Family Medicine

## 2023-09-06 ENCOUNTER — Encounter: Payer: Self-pay | Admitting: Family Medicine

## 2023-09-06 VITALS — BP 132/70 | HR 69 | Temp 98.2°F | Ht <= 58 in | Wt 137.4 lb

## 2023-09-06 DIAGNOSIS — E7800 Pure hypercholesterolemia, unspecified: Secondary | ICD-10-CM

## 2023-09-06 DIAGNOSIS — Z0000 Encounter for general adult medical examination without abnormal findings: Secondary | ICD-10-CM | POA: Diagnosis not present

## 2023-09-06 DIAGNOSIS — Z23 Encounter for immunization: Secondary | ICD-10-CM

## 2023-09-06 DIAGNOSIS — R87610 Atypical squamous cells of undetermined significance on cytologic smear of cervix (ASC-US): Secondary | ICD-10-CM | POA: Insufficient documentation

## 2023-09-06 DIAGNOSIS — R1013 Epigastric pain: Secondary | ICD-10-CM

## 2023-09-06 DIAGNOSIS — Z1211 Encounter for screening for malignant neoplasm of colon: Secondary | ICD-10-CM

## 2023-09-06 DIAGNOSIS — R030 Elevated blood-pressure reading, without diagnosis of hypertension: Secondary | ICD-10-CM

## 2023-09-06 LAB — TSH: TSH: 4.45 u[IU]/mL (ref 0.35–5.50)

## 2023-09-06 LAB — LIPID PANEL
Cholesterol: 218 mg/dL — ABNORMAL HIGH (ref 0–200)
HDL: 57.1 mg/dL (ref >39.00)
LDL Cholesterol: 146 mg/dL — ABNORMAL HIGH (ref 0–99)
NonHDL: 160.68
Total CHOL/HDL Ratio: 4
Triglycerides: 75 mg/dL (ref 0.0–149.0)
VLDL: 15 mg/dL (ref 0.0–40.0)

## 2023-09-06 LAB — COMPREHENSIVE METABOLIC PANEL WITH GFR
ALT: 17 U/L (ref 0–35)
AST: 19 U/L (ref 0–37)
Albumin: 4.2 g/dL (ref 3.5–5.2)
Alkaline Phosphatase: 87 U/L (ref 39–117)
BUN: 12 mg/dL (ref 6–23)
CO2: 29 meq/L (ref 19–32)
Calcium: 9.1 mg/dL (ref 8.4–10.5)
Chloride: 103 meq/L (ref 96–112)
Creatinine, Ser: 0.71 mg/dL (ref 0.40–1.20)
GFR: 91.31 mL/min (ref >60.00)
Glucose, Bld: 88 mg/dL (ref 70–99)
Potassium: 4.1 meq/L (ref 3.5–5.1)
Sodium: 138 meq/L (ref 135–145)
Total Bilirubin: 0.4 mg/dL (ref 0.2–1.2)
Total Protein: 7.4 g/dL (ref 6.0–8.3)

## 2023-09-06 LAB — CBC WITH DIFFERENTIAL/PLATELET
Basophils Absolute: 0 10*3/uL (ref 0.0–0.1)
Basophils Relative: 0.5 % (ref 0.0–3.0)
Eosinophils Absolute: 0.1 10*3/uL (ref 0.0–0.7)
Eosinophils Relative: 1.7 % (ref 0.0–5.0)
HCT: 40.1 % (ref 36.0–46.0)
Hemoglobin: 13.3 g/dL (ref 12.0–15.0)
Lymphocytes Relative: 22.9 % (ref 12.0–46.0)
Lymphs Abs: 1.6 10*3/uL (ref 0.7–4.0)
MCHC: 33.1 g/dL (ref 30.0–36.0)
MCV: 86 fl (ref 78.0–100.0)
Monocytes Absolute: 0.6 10*3/uL (ref 0.1–1.0)
Monocytes Relative: 8.1 % (ref 3.0–12.0)
Neutro Abs: 4.6 10*3/uL (ref 1.4–7.7)
Neutrophils Relative %: 66.8 % (ref 43.0–77.0)
Platelets: 311 10*3/uL (ref 150.0–400.0)
RBC: 4.66 Mil/uL (ref 3.87–5.11)
RDW: 12.6 % (ref 11.5–15.5)
WBC: 6.9 10*3/uL (ref 4.0–10.5)

## 2023-09-06 NOTE — Assessment & Plan Note (Signed)
 Utd colonoscopy 09/2022

## 2023-09-06 NOTE — Assessment & Plan Note (Signed)
 Disc goals for lipids and reasons to control them Rev last labs with pt Rev low sat fat diet in detail Taking crestor  10 mg daily   Lab today

## 2023-09-06 NOTE — Patient Instructions (Addendum)
 Shingrix vaccine today   2nd shingrix vaccine is due 2-6 months later  You can schedule a nurse visit for that   Labs today    I put the referral in for  GYN visit with [facility] in [place]  Please let us  know if you don't hear in 1-2 weeks to set that up You can also look them up and call them   Stay active  Add some strength training to your routine, this is important for bone and brain health and can reduce your risk of falls and help your body use insulin properly and regulate weight  Light weights, exercise bands , and internet videos are a good way to start  Yoga (chair or regular), machines , floor exercises or a gym with machines are also good options   Make sure you get at least 2000 international units of vitamin D3 in your multi vitamin  If not, you need to add some   Take care of yourself

## 2023-09-06 NOTE — Assessment & Plan Note (Signed)
 Blood pressure is much better on 2nd check  bp in fair control at this time  BP Readings from Last 1 Encounters:  09/06/23 132/70   No medications currently  Most recent labs reviewed  Disc lifstyle change with low sodium diet and exercise  Will continue to watch

## 2023-09-06 NOTE — Assessment & Plan Note (Signed)
 Reviewed health habits including diet and exercise and skin cancer prevention Reviewed appropriate screening tests for age  Also reviewed health mt list, fam hx and immunization status , as well as social and family history   See HPI Labs reviewed and ordered Health Maintenance  Topic Date Due   HIV Screening  Never done   Hepatitis C Screening  Never done   Zoster (Shingles) Vaccine (1 of 2) Never done   COVID-19 Vaccine (3 - 2024-25 season) 11/19/2022   Pap with HPV screening  01/05/2023   Flu Shot  10/19/2023   Mammogram  03/01/2025   Colon Cancer Screening  10/10/2025   DTaP/Tdap/Td vaccine (2 - Td or Tdap) 06/11/2031   HPV Vaccine  Aged Out   Meningitis B Vaccine  Aged Out    Shingrix given today  Lab today  Due for gyn follow up- referral done  Discussed fall prevention, supplements and exercise for bone density  PHQ 0

## 2023-09-06 NOTE — Assessment & Plan Note (Signed)
Pepcid prn.

## 2023-09-06 NOTE — Progress Notes (Signed)
 Subjective:    Patient ID: Stacey Parrish, female    DOB: 04-Jul-1960, 63 y.o.   MRN: [id number]  HPI  Here for health maintenance exam and to review chronic medical problems   Wt Readings from Last 3 Encounters:  09/06/23 137 lb 6.4 oz (62.3 kg)  10/11/22 138 lb (62.6 kg)  09/26/22 138 lb (62.6 kg)   30.35 kg/m  Vitals:   09/06/23 0948 09/06/23 1017  BP: (!) 152/84 132/70  Pulse: 69   Temp: 98.2 F (36.8 C)   SpO2: 97%     Immunization History  Administered Date(s) Administered   Influenza, Mdck, Trivalent,PF 6+ MOS(egg free) 01/17/2023   Influenza-Unspecified 02/05/2020   PFIZER(Purple Top)SARS-COV-2 Vaccination 06/20/2019, 07/15/2019   Tdap 06/10/2021   Zoster Recombinant(Shingrix) 09/06/2023    Health Maintenance Due  Topic Date Due   HIV Screening  Never done   Hepatitis C Screening  Never done   COVID-19 Vaccine (3 - 2024-25 season) 11/19/2022   Cervical Cancer Screening (HPV/Pap Cotest)  01/05/2023   Shingrix -wants to get today   Mammogram 02/2023  Self breast exam- no lumps or changes   Gyn health Pap 12/2019  ascus with neg HPV  Needs ref to go back to gyn  No complaints    Colon cancer screening -colonoscopy 09/2022   Bone health   Falls-none  Fractures-none  Supplements -mvi    Exercise  Walking  A lot of standing  No strength training    Mood    09/06/2023    9:49 AM 09/09/2021    8:52 AM 06/10/2021    4:40 PM 03/05/2020   12:42 PM  Depression screen PHQ 2/9  Decreased Interest 0 0 0 0  Down, Depressed, Hopeless 0 0 0 0  PHQ - 2 Score 0 0 0 0  Altered sleeping 0  0   Tired, decreased energy 0  0   Change in appetite 0  0   Feeling bad or failure about yourself  0  0   Trouble concentrating 0  0   Moving slowly or fidgety/restless 0  0   Suicidal thoughts 0  0   PHQ-9 Score 0  0   Difficult doing work/chores Not difficult at all  Not difficult at all    History of elevated blood pressure  No cp or palpitations or  headaches or edema  No medication  BP Readings from Last 3 Encounters:  09/06/23 132/70  10/11/22 (!) 114/59  08/29/22 135/70    Has a blood pressure cuff  Usually 130-135 systolic at home   Lab Results  Component Value Date   NA 136 08/23/2022   K 4.4 08/23/2022   CO2 23 08/23/2022   GLUCOSE 79 08/23/2022   BUN 13 08/23/2022   CREATININE 0.75 08/23/2022   CALCIUM  9.4 08/23/2022   GFR 86.13 08/23/2022    Hyperlipidemia Lab Results  Component Value Date   CHOL 155 10/10/2022   HDL 57.40 10/10/2022   LDLCALC 81 10/10/2022   TRIG 79.0 10/10/2022   CHOLHDL 3 10/10/2022   Due for labs Crestor  10 mg daily      Patient Active Problem List   Diagnosis Date Noted   ASCUS of cervix with negative high risk HPV 09/06/2023   Colon cancer screening 08/29/2022   History of colon polyps 08/29/2022   Elevated BP without diagnosis of hypertension 08/29/2022   Hyperlipidemia 05/14/2020   Dyspepsia 05/14/2020   Routine general medical examination at a health care facility 03/05/2020  Past Medical History:  Diagnosis Date   Allergy    GERD (gastroesophageal reflux disease)    Hyperlipidemia    History reviewed. No pertinent surgical history. Social History   Tobacco Use   Smoking status: Never   Smokeless tobacco: Never  Substance Use Topics   Alcohol use: Not Currently   Drug use: Never   Family History  Problem Relation Age of Onset   Arthritis Mother    Hearing loss Mother    Hyperlipidemia Mother    Hypertension Mother    Colon cancer Father    Alcohol abuse Father    Esophageal cancer Neg Hx    Stomach cancer Neg Hx    Rectal cancer Neg Hx    No Known Allergies Current Outpatient Medications on File Prior to Visit  Medication Sig Dispense Refill   famotidine  (PEPCID ) 20 MG tablet TAKE 1 TABLET BY MOUTH TWICE A DAY 180 tablet 0   rosuvastatin  (CRESTOR ) 10 MG tablet TAKE 1 TABLET BY MOUTH EVERY DAY 90 tablet 0   No current facility-administered  medications on file prior to visit.    Review of Systems  Constitutional:  Negative for activity change, appetite change, fatigue, fever and unexpected weight change.  HENT:  Negative for congestion, ear pain, rhinorrhea, sinus pressure and sore throat.   Eyes:  Negative for pain, redness and visual disturbance.  Respiratory:  Negative for cough, shortness of breath and wheezing.   Cardiovascular:  Negative for chest pain and palpitations.  Gastrointestinal:  Negative for abdominal pain, blood in stool, constipation and diarrhea.  Endocrine: Negative for polydipsia and polyuria.  Genitourinary:  Negative for dysuria, frequency and urgency.  Musculoskeletal:  Negative for arthralgias, back pain and myalgias.  Skin:  Negative for pallor and rash.  Allergic/Immunologic: Negative for environmental allergies.  Neurological:  Negative for dizziness, syncope and headaches.  Hematological:  Negative for adenopathy. Does not bruise/bleed easily.  Psychiatric/Behavioral:  Negative for decreased concentration and dysphoric mood. The patient is not nervous/anxious.        Objective:   Physical Exam Constitutional:      General: She is not in acute distress.    Appearance: Normal appearance. She is well-developed. She is obese. She is not ill-appearing or diaphoretic.  HENT:     Head: Normocephalic and atraumatic.     Right Ear: Tympanic membrane, ear canal and external ear normal.     Left Ear: Tympanic membrane, ear canal and external ear normal.     Nose: Nose normal. No congestion.     Mouth/Throat:     Mouth: Mucous membranes are moist.     Pharynx: Oropharynx is clear. No posterior oropharyngeal erythema.   Eyes:     General: No scleral icterus.    Extraocular Movements: Extraocular movements intact.     Conjunctiva/sclera: Conjunctivae normal.     Pupils: Pupils are equal, round, and reactive to light.   Neck:     Thyroid: No thyromegaly.     Vascular: No carotid bruit or JVD.    Cardiovascular:     Rate and Rhythm: Normal rate and regular rhythm.     Pulses: Normal pulses.     Heart sounds: Normal heart sounds.     No gallop.  Pulmonary:     Effort: Pulmonary effort is normal. No respiratory distress.     Breath sounds: Normal breath sounds. No wheezing.     Comments: Good air exch Chest:     Chest wall: No tenderness.  Abdominal:  General: Bowel sounds are normal. There is no distension or abdominal bruit.     Palpations: Abdomen is soft. There is no mass.     Tenderness: There is no abdominal tenderness.     Hernia: No hernia is present.  Genitourinary:    Comments: Breast exam: No mass, nodules, thickening, tenderness, bulging, retraction, inflamation, nipple discharge or skin changes noted.  No axillary or clavicular LA.      Musculoskeletal:        General: No tenderness. Normal range of motion.     Cervical back: Normal range of motion and neck supple. No rigidity. No muscular tenderness.     Right lower leg: No edema.     Left lower leg: No edema.     Comments: No kyphosis   Lymphadenopathy:     Cervical: No cervical adenopathy.   Skin:    General: Skin is warm and dry.     Coloration: Skin is not pale.     Findings: No erythema or rash.     Comments: Some scattered lentigines and tags    Neurological:     Mental Status: She is alert. Mental status is at baseline.     Cranial Nerves: No cranial nerve deficit.     Motor: No abnormal muscle tone.     Coordination: Coordination normal.     Gait: Gait normal.     Deep Tendon Reflexes: Reflexes are normal and symmetric. Reflexes normal.   Psychiatric:        Mood and Affect: Mood normal.        Cognition and Memory: Cognition and memory normal.           Assessment & Plan:   Problem List Items Addressed This Visit       Other   Routine general medical examination at a health care facility - Primary   Reviewed health habits including diet and exercise and skin cancer  prevention Reviewed appropriate screening tests for age  Also reviewed health mt list, fam hx and immunization status , as well as social and family history   See HPI Labs reviewed and ordered Health Maintenance  Topic Date Due   HIV Screening  Never done   Hepatitis C Screening  Never done   Zoster (Shingles) Vaccine (1 of 2) Never done   COVID-19 Vaccine (3 - 2024-25 season) 11/19/2022   Pap with HPV screening  01/05/2023   Flu Shot  10/19/2023   Mammogram  03/01/2025   Colon Cancer Screening  10/10/2025   DTaP/Tdap/Td vaccine (2 - Td or Tdap) 06/11/2031   HPV Vaccine  Aged Out   Meningitis B Vaccine  Aged Out    Shingrix given today  Lab today  Due for gyn follow up- referral done  Discussed fall prevention, supplements and exercise for bone density  PHQ 0         Relevant Orders   TSH   Lipid Panel   Comprehensive metabolic panel with GFR   CBC with Differential/Platelet   Hyperlipidemia   Disc goals for lipids and reasons to control them Rev last labs with pt Rev low sat fat diet in detail Taking crestor  10 mg daily   Lab today      Relevant Orders   Lipid Panel   Comprehensive metabolic panel with GFR   Elevated BP without diagnosis of hypertension   Blood pressure is much better on 2nd check  bp in fair control at this time  BP Readings from  Last 1 Encounters:  09/06/23 132/70   No medications currently  Most recent labs reviewed  Disc lifstyle change with low sodium diet and exercise  Will continue to watch      Dyspepsia   Pepcid  prn      Colon cancer screening   Utd colonoscopy 09/2022       ASCUS of cervix with negative high risk HPV   Last pap ascus neg HPV in 2021  Needs follow up with gyn Referral done to [facility] gyn for routine gyn care      Relevant Orders   Ambulatory referral to Gynecology   Other Visit Diagnoses       Need for shingles vaccine       Relevant Orders   Varicella-zoster vaccine IM (Completed)

## 2023-09-06 NOTE — Assessment & Plan Note (Signed)
 Last pap ascus neg HPV in 2021  Needs follow up with gyn Referral done to [facility] gyn for routine gyn care

## 2023-11-08 ENCOUNTER — Ambulatory Visit

## 2023-11-08 DIAGNOSIS — Z23 Encounter for immunization: Secondary | ICD-10-CM

## 2023-11-08 NOTE — Progress Notes (Signed)
 Per orders of Dr. Laine Balls, injection of Shingrix  given by Andrez JAYSON Orchard in left deltoid. Patient tolerated injection well.

## 2023-11-21 ENCOUNTER — Other Ambulatory Visit: Payer: Self-pay | Admitting: Family Medicine

## 2024-02-27 LAB — HM MAMMOGRAPHY

## 2024-02-28 ENCOUNTER — Encounter: Payer: Self-pay | Admitting: Family Medicine
